# Patient Record
Sex: Male | Born: 1945 | Race: White | Hispanic: No | Smoking: Former smoker
Health system: Southern US, Community
[De-identification: ages and names within clinical notes are randomized; demographics above are authoritative.]

## PROBLEM LIST (undated history)

## (undated) DIAGNOSIS — K219 Gastro-esophageal reflux disease without esophagitis: Secondary | ICD-10-CM

## (undated) DIAGNOSIS — I739 Peripheral vascular disease, unspecified: Secondary | ICD-10-CM

## (undated) DIAGNOSIS — D649 Anemia, unspecified: Secondary | ICD-10-CM

## (undated) DIAGNOSIS — M79606 Pain in leg, unspecified: Secondary | ICD-10-CM

## (undated) DIAGNOSIS — R0602 Shortness of breath: Secondary | ICD-10-CM

## (undated) DIAGNOSIS — Z87891 Personal history of nicotine dependence: Secondary | ICD-10-CM

## (undated) DIAGNOSIS — IMO0001 Reserved for inherently not codable concepts without codable children: Secondary | ICD-10-CM

## (undated) DIAGNOSIS — J189 Pneumonia, unspecified organism: Secondary | ICD-10-CM

## (undated) DIAGNOSIS — M2550 Pain in unspecified joint: Secondary | ICD-10-CM

## (undated) DIAGNOSIS — I10 Essential (primary) hypertension: Secondary | ICD-10-CM

## (undated) DIAGNOSIS — M1990 Unspecified osteoarthritis, unspecified site: Secondary | ICD-10-CM

## (undated) HISTORY — DX: Anemia, unspecified: D64.9

## (undated) HISTORY — DX: Unspecified osteoarthritis, unspecified site: M19.90

## (undated) HISTORY — PX: JOINT REPLACEMENT: SHX530

## (undated) HISTORY — DX: Peripheral vascular disease, unspecified: I73.9

## (undated) HISTORY — DX: Pain in unspecified joint: M25.50

## (undated) HISTORY — DX: Gastro-esophageal reflux disease without esophagitis: K21.9

## (undated) HISTORY — DX: Personal history of nicotine dependence: Z87.891

## (undated) HISTORY — DX: Essential (primary) hypertension: I10

## (undated) HISTORY — DX: Reserved for inherently not codable concepts without codable children: IMO0001

## (undated) HISTORY — DX: Pain in leg, unspecified: M79.606

## (undated) HISTORY — PX: TOTAL HIP ARTHROPLASTY: SHX124

---
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
## 2009-12-13 ENCOUNTER — Encounter: Payer: Self-pay | Admitting: Cardiovascular Disease

## 2010-08-16 ENCOUNTER — Telehealth (INDEPENDENT_AMBULATORY_CARE_PROVIDER_SITE_OTHER): Payer: Self-pay | Admitting: *Deleted

## 2010-08-20 ENCOUNTER — Encounter: Payer: Self-pay | Admitting: Cardiovascular Disease

## 2010-08-20 ENCOUNTER — Encounter: Payer: Self-pay | Admitting: Cardiology

## 2010-08-20 ENCOUNTER — Ambulatory Visit: Payer: Self-pay | Admitting: Cardiovascular Disease

## 2010-08-20 ENCOUNTER — Encounter (HOSPITAL_COMMUNITY): Admission: RE | Admit: 2010-08-20 | Discharge: 2010-09-03 | Payer: Self-pay | Admitting: Family Medicine

## 2010-08-20 ENCOUNTER — Ambulatory Visit: Payer: Self-pay

## 2010-09-19 ENCOUNTER — Ambulatory Visit: Payer: Self-pay | Admitting: Vascular Surgery

## 2010-12-18 NOTE — Assessment & Plan Note (Signed)
Summary: Cardiology Nuclear Testing  Nuclear Med Background Indications for Stress Test: Evaluation for Ischemia   History: GXT  History Comments: 01/24/10 [id number]  Symptoms: Fatigue  Symptoms Comments: c/o (B) leg pain with walking, R>L   Nuclear Pre-Procedure Cardiac Risk Factors: History of Smoking, Hypertension, IDDM Type 2, Smoker Caffeine/Decaff Intake: none NPO After: 7:30 AM Lungs: Clear.  O2 Sat 98% on RA. IV 0.9% NS with Angio Cath: 22g     IV Site: R Hand IV Started by: Cathlyn Parsons, RN Chest Size (in) 42     Height (in): 71 Weight (lb): 163 BMI: 22.82 Tech Comments: Lopressor not held.  Nuclear Med Study 1 or 2 day study:  1 day     Stress Test Type:  Treadmill/Lexiscan Reading MD:  Kristeen Miss, MD     Referring MD:  [name], MD Resting Radionuclide:  Technetium 69m Tetrofosmin     Resting Radionuclide Dose:  11 mCi  Stress Radionuclide:  Technetium 56m Tetrofosmin     Stress Radionuclide Dose:  33 mCi   Stress Protocol Exercise Time (min):  2:00 min     Max HR:  93 bpm     Predicted Max HR:  155 bpm  Max Systolic BP: 168 mm Hg     Percent Max HR:  60 %Rate Pressure Product:  62130  Lexiscan: 0.4 mg   Stress Test Technologist:  [employer], CMA-N     Nuclear Technologist:  Doyne Keel, CNMT  Rest Procedure  Myocardial perfusion imaging was performed at rest 45 minutes following the intravenous administration of Technetium 9m Tetrofosmin.  Stress Procedure  The patient received IV Lexiscan 0.4 mg over 15-seconds with concurrent low level exercise and then Technetium 40m Tetrofosmin was injected at 30-seconds.  There were no significant changes with Lexiscan.  Quantitative spect images were obtained after a 45 minute delay.  QPS Raw Data Images:  Normal; no motion artifact; normal heart/lung ratio. Stress Images:  Normal homogeneous uptake in all areas of the myocardium. Rest Images:  Normal homogeneous uptake in all areas of the  myocardium. Subtraction (SDS):  No evidence of ischemia. Transient Ischemic Dilatation:  1.05  (Normal <1.22)  Lung/Heart Ratio:  .36  (Normal <0.45)  Quantitative Gated Spect Images QGS EDV:  98 ml QGS ESV:  40 ml QGS EF:  59 % QGS cine images:  Normal LV systolic function.  Findings Normal nuclear study      Overall Impression  Exercise Capacity: Lexiscan with low level  exercise. BP Response: Normal blood pressure response. Clinical Symptoms: No chest pain ECG Impression: No significant ST segment change suggestive of ischemia. Overall Impression: Normal stress nuclear study. Overall Impression Comments: Normal stress nulcear study  Appended Document: Cardiology Nuclear Testing copy sent to Dr. Christell Constant

## 2010-12-18 NOTE — Progress Notes (Signed)
Summary: Nuc Pre-Procedure  Phone Note Outgoing Call Call back at [facility] Phone [phone]   Call placed by: Antionette Char RN,  August 16, 2010 3:05 PM Call placed to: Patient Reason for Call: Confirm/change Appt Summary of Call: Reviewed information on [employer] Information Sheet (see scanned document for further details).  Spoke with patient.     Nuclear Med Background Indications for Stress Test: Evaluation for Ischemia   History: GXT  History Comments: 01/24/10 abn- Failed to achieve target HR  Symptoms: DOE    Nuclear Pre-Procedure Cardiac Risk Factors: Hypertension, Smoker

## 2011-02-12 ENCOUNTER — Encounter (INDEPENDENT_AMBULATORY_CARE_PROVIDER_SITE_OTHER): Payer: Medicare PPO | Admitting: Vascular Surgery

## 2011-02-12 DIAGNOSIS — I70219 Atherosclerosis of native arteries of extremities with intermittent claudication, unspecified extremity: Secondary | ICD-10-CM

## 2011-02-12 DIAGNOSIS — M2550 Pain in unspecified joint: Secondary | ICD-10-CM

## 2011-02-12 DIAGNOSIS — M79606 Pain in leg, unspecified: Secondary | ICD-10-CM

## 2011-02-12 HISTORY — DX: Pain in leg, unspecified: M79.606

## 2011-02-12 HISTORY — DX: Pain in unspecified joint: M25.50

## 2011-02-12 NOTE — Consult Note (Signed)
NEW PATIENT CONSULTATION  [name], [name] DOB:  1945/12/13                                       02/12/2011 CHART#:[id number]  The patient is a 65 year old gentleman with progressive worsening of claudication in both lower extremities, right equal to left.  This occurs in the calf after walking about one to two blocks and he is required to stop and rest for 1-5 minutes at which time he can proceed. He has no history of rest pain, nonhealing ulcers, infection or ulceration.  He has never had any treatment of this problem in the past.  CHRONIC MEDICAL PROBLEMS: 1. Type 1 diabetes mellitus. 2. Hypertension. 3. History of severe tobacco abuse.  The patient admits to smoking     three to five packs of cigarettes for most of his life but did stop     smoking 2 years ago.  Currently does not smoke. 4. Negative for hyperlipidemia, coronary artery disease, COPD or     stroke. 5. Has had a previous left hip replacement.  SOCIAL HISTORY:  He is widowed, has 2 children, is retired, currently does not use tobacco and drinks about 12 beers per week.  FAMILY HISTORY:  Positive for diabetes in a brother.  Negative for coronary artery disease and stroke.  REVIEW OF SYSTEMS:  Positive for arthritis and joint pain particularly in his right shoulder where he has a rotator cuff problem, reflux esophagitis, constipation.  All other systems were negative in complete review of systems.  PHYSICAL EXAM:  Vital signs:  Blood pressure 169/81, heart rate 62, respirations 14.  General:  He is a well-developed, well-nourished male who is no apparent distress, alert and oriented x3.  HEENT:  Normal for age.  EOMs intact.  Lungs:  Clear to auscultation.  No rhonchi or wheezing.  Cardiovascular:  Reveals a regular rhythm.  No murmurs. Carotid pulses are 3+.  No audible bruits.  Abdomen:  Soft, nontender with no masses.  Musculoskeletal:  Exam is free of major deformities. Neurological:   Normal.  Skin:  Free of rashes.  Lower extremity:  Exam reveals 2+ femoral, popliteal and posterior tibial pulse on the left. Right leg has a 2+ femoral, absent popliteal and distal pulses.  Both feet are adequately perfused with no infection.  I reviewed the lower extremity arterial Doppler studies which I interpreted in our vascular lab in November of 2011.  This reveals an ABI of 0.54 on the right and 0.85 on the left with bilateral superficial femoral occlusive disease, right being totally occluded, left being stenotic.  This patient does have femoral and popliteal occlusive disease causing his symptoms.  We will schedule him for an angiogram to be done by Dr. Imogene Burn on Thursday, April 5 to see if PTA and stenting is feasible treatment option versus bypass surgery.    Quita Skye Hart Rochester, M.D. Electronically Signed  JDL/MEDQ  D:  02/12/2011  T:  02/12/2011  Job:  [id number]  cc:   Belva Agee, NP

## 2011-02-21 ENCOUNTER — Ambulatory Visit (HOSPITAL_COMMUNITY)
Admission: RE | Admit: 2011-02-21 | Discharge: 2011-02-21 | Disposition: A | Discharge status: Home / Self Care | Payer: Medicare PPO | Source: Ambulatory Visit | Attending: Vascular Surgery | Admitting: Vascular Surgery

## 2011-02-21 DIAGNOSIS — I70219 Atherosclerosis of native arteries of extremities with intermittent claudication, unspecified extremity: Secondary | ICD-10-CM

## 2011-02-21 DIAGNOSIS — E109 Type 1 diabetes mellitus without complications: Secondary | ICD-10-CM | POA: Insufficient documentation

## 2011-02-21 DIAGNOSIS — I10 Essential (primary) hypertension: Secondary | ICD-10-CM | POA: Insufficient documentation

## 2011-02-21 DIAGNOSIS — Z87891 Personal history of nicotine dependence: Secondary | ICD-10-CM | POA: Insufficient documentation

## 2011-02-21 LAB — POCT I-STAT, CHEM 8
BUN: 14 mg/dL (ref 6–23)
Chloride: 104 mEq/L (ref 96–112)
HCT: 30 % — ABNORMAL LOW (ref 39.0–52.0)
Potassium: 5.2 mEq/L — ABNORMAL HIGH (ref 3.5–5.1)

## 2011-02-21 LAB — GLUCOSE, CAPILLARY: Glucose-Capillary: 184 mg/dL — ABNORMAL HIGH (ref 70–99)

## 2011-02-22 NOTE — Op Note (Signed)
NAMEWILBERN, PENNYPACKER NO.:  [id number]  MEDICAL RECORD NO.:  [id number]           PATIENT TYPE:  O  LOCATION:  [facility]                         FACILITY:  [facility]  PHYSICIAN:  Fransisco Hertz, MD       DATE OF BIRTH:  10/18/1946  DATE OF PROCEDURE:  02/21/2011 DATE OF DISCHARGE:  02/21/2011                              OPERATIVE REPORT   PROCEDURE:  Left common femoral artery cannulation under ultrasound guidance, aortogram bilateral leg runoff.  PREOPERATIVE DIAGNOSIS:  Bilateral leg claudication.  POSTOPERATIVE DIAGNOSIS:  Bilateral leg claudication.  SURGEON:  Fransisco Hertz, MD.  ANESTHESIA:  Conscious sedation.  ESTIMATED BLOOD LOSS:  Minimal.  CONTRAST:  137 mL.  SPECIMENS:  None.  FINDINGS:  Included: 1. Patent aorta. 2. Patent bilateral renal arteries.  The right has a stenosis about 2-     cm distal to the takeoff. 3. Patent superior mesenteric artery. 4. Patent bilateral common iliac arteries that are calcified and     patent bilateral external iliac artery. 5. Patent right internal iliac artery and there is a heavily diseased     left internal iliac artery that is nearly occluded. 6. Patent but atherosclerotic bilateral common femoral artery. 7. The right superficial femoral artery has a proximal knob that is     less than 2 mm in length, otherwise, the rest of the right     superficial femoral artery is occluded. 8. The left superficial femoral artery is patent, but had multiple     stenoses, one greater than 75% and two about 50%. 9. Bilateral profunda artery is patent. 10.The right above-the-knee popliteal artery reconstitutes. 11.The left above-the-knee popliteal artery is patent and in line with     SFA. 12.Bilateral popliteal arteries are patent at the level of the knee     and below-the-knee. 13.The bilateral trifurcations are patent. 14.Bilateral anterior tibial arteries are occluded. 15.Bilateral peroneal and posterior tibial arteries  continued down to     the level of the ankles. 16.The right runoff is a posterior tibial and peroneal, which are very     small only about 1-2 mm. 17.The left runoff is a posterior tibial.  INDICATIONS:  This is a 65 year old patient that presents with bilateral leg claudication by symptoms, left greater than right.  However, his ABIs demonstrate worse disease in the right leg.  Given his deterioration and symptomatology, Dr. Hart Rochester felt that diagnostic angiography was going to be necessary to determine whether there was anything that could be treated from an endovascular viewpoint or whether he would need surgical intervention.  The patient is aware of the risks of this procedure included bleeding, infection, possible access complications, possible embolization, possible rupture, possible need for emergent surgical intervention, and possible additional surgical operation needed.  The patient was aware of the risks and agreed to proceed forward.  DESCRIPTION OF OPERATION:  After full informed written consent was obtained from the patient, he was brought back to the angio suite and placed supine upon the angio table.  Prior to induction, he was connected to monitoring equipment, and then he was given conscious  sedation, amounts of which are documented in chart.  He was then prepped and draped in standard fashion for aortogram and bilateral leg runoff.  We turned our attention first to his left groin.  Under ultrasound, I identified the common femoral artery.  After injecting 1% lidocaine, I obtained some local anesthesia and then cannulated the left common femoral artery with an 18-gauge needle and then passed a Bentson wire up into the aorta.  The needle was exchanged for 5-French sheath over the wire, then the Omniflush catheter was loaded to the level of L1, this was connected to power injector circuit.  After performing a declotting and de- airing maneuver, I performed a power  injector aortogram.  The aortic findings are as listed above.  I then pulled the catheter down the level of the bifurcation and did oblique pelvic shot.  Findings of which are listed above.  I then set the patient up for bilateral leg automated runoff. The findings of which are listed above.  Based on these findings, I felt that there is inadequate residual superficial femoral artery to successfully complete a subintimal reconstitution of the SFA.  My concern is that stenting of the entry site might be necessary. Any stent placed in the SFA will be too close to the bifurcation and possibly could cover the profunda artery.  Subsequently, I do not feel it is in this patient's advantage to proceed forward with endovascular recanalization to right SFA.  In regards to the left leg, there is calcification in the SFA that I think it is better treated with orbital atherectomy.  Also with the presence of a left common femoral artery sheath, it is not possible geometry wise to proceed forward with this patient's intervention today.  I would addressed first the right leg as this is the more concerning leg in regards to his perfusion and then I would address the left leg at a different session with a right common femoral artery approach and an orbital atherectomy in SFA.  COMPLICATIONS:  None.  CONDITION:  Stable.     Fransisco Hertz, MD     BLC/MEDQ  D:  02/21/2011  T:  02/22/2011  Job:  [id number]  Electronically Signed by Leonides Sake MD on 02/22/2011 05:46:23 PM

## 2011-02-26 ENCOUNTER — Ambulatory Visit (INDEPENDENT_AMBULATORY_CARE_PROVIDER_SITE_OTHER): Payer: Medicare PPO | Admitting: Vascular Surgery

## 2011-02-26 DIAGNOSIS — I70219 Atherosclerosis of native arteries of extremities with intermittent claudication, unspecified extremity: Secondary | ICD-10-CM

## 2011-02-26 LAB — GLUCOSE, CAPILLARY

## 2011-02-27 ENCOUNTER — Encounter (HOSPITAL_COMMUNITY): Payer: Medicare PPO | Admitting: Radiology

## 2011-02-27 NOTE — Assessment & Plan Note (Signed)
OFFICE VISIT  TOU, HAYNER DOB:  [birth year]                                       02/26/2011 CHART#:[id number]  The patient returns today for further discussion regarding his severe claudication in the right leg.  He underwent angiography by Dr. Imogene Burn on April 5th.  He had a right superficial femoral occlusion noted with a very short stump of superficial femoral artery being patent proximally. He had reconstitution of his superficial femoral artery above the knee with some disease behind the knee joint, below knee patent popliteal artery with small tibial vessels and occlusion of the anterior tibial artery in the mid calf.  He is having severe claudication symptoms which are limiting him significantly and would like to proceed with revascularization.  He was not felt to be a candidate for PTA and stenting of his right superficial femoral artery.  He has some mild left superficial femoral disease which is a candidate for PTA.  CHRONIC MEDICAL PROBLEMS: 1. Type 1 diabetes mellitus. 2. Hypertension. 3. History of severe tobacco abuse. 4. Negative for the hyperlipidemia, coronary artery disease, COPD or     stroke - the patient had a negative stress Cardiolite performed     October 2011 with normal LV function, no evidence of ischemia.  SOCIAL HISTORY:  Widowed, has 2 children, is retired.  Does not use tobacco in a few years but does have a very strong history of tobacco abuse in the past.  REVIEW OF SYSTEMS:  Negative for chest pain, dyspnea on exertion, PND, orthopnea, chronic bronchitis.  All other systems in complete review of systems are negative.  PHYSICAL EXAMINATION:  Vital signs:  Blood pressure is 114/65, heart rate 83, respirations 16.  General:  He is a thin, well-developed male who is in no apparent stress.  Alert and oriented x3.  HEENT:  Normal for age.  Lungs:  Clear to auscultation.  No rhonchi or wheezing. Cardiovascular:  Regular  rhythm, no murmurs.  Carotid pulses 3+, no bruits.  Abdomen:  Soft, nontender with no masses.  Lower extremity exam reveals 3+ femoral, absent popliteal and distal pulse on the right, 3+ femoral and 1-2+ popliteal pulse on the left.  I visualized the great saphenous vein with the SonoSite today and it was marginal in size, possibly usable.  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  The patient would like proceed with femoral-popliteal bypass which we have scheduled for Wednesday, April 18.  I discussed this at length with him and his daughter and answered their questions. The risks and benefits are thoroughly discussed and he would like to proceed.    Quita Skye Hart Rochester, M.D. Electronically Signed  JDL/MEDQ  D:  02/26/2011  T:  02/27/2011  Job:  [id number]

## 2011-03-01 ENCOUNTER — Encounter (HOSPITAL_COMMUNITY)
Admission: RE | Admit: 2011-03-01 | Discharge: 2011-03-01 | Disposition: A | Discharge status: Home / Self Care | Payer: Medicare PPO | Source: Ambulatory Visit | Attending: Vascular Surgery | Admitting: Vascular Surgery

## 2011-03-01 ENCOUNTER — Other Ambulatory Visit: Payer: Self-pay | Admitting: Vascular Surgery

## 2011-03-01 ENCOUNTER — Ambulatory Visit (HOSPITAL_COMMUNITY)
Admission: RE | Admit: 2011-03-01 | Discharge: 2011-03-01 | Disposition: A | Discharge status: Home / Self Care | Payer: Medicare PPO | Source: Ambulatory Visit | Attending: Vascular Surgery | Admitting: Vascular Surgery

## 2011-03-01 DIAGNOSIS — I739 Peripheral vascular disease, unspecified: Secondary | ICD-10-CM

## 2011-03-01 DIAGNOSIS — Z01810 Encounter for preprocedural cardiovascular examination: Secondary | ICD-10-CM | POA: Insufficient documentation

## 2011-03-01 DIAGNOSIS — Z01812 Encounter for preprocedural laboratory examination: Secondary | ICD-10-CM | POA: Insufficient documentation

## 2011-03-01 DIAGNOSIS — Z01818 Encounter for other preprocedural examination: Secondary | ICD-10-CM | POA: Insufficient documentation

## 2011-03-01 LAB — COMPREHENSIVE METABOLIC PANEL
Albumin: 4 g/dL (ref 3.5–5.2)
BUN: 15 mg/dL (ref 6–23)
CO2: 25 mEq/L (ref 19–32)
Chloride: 104 mEq/L (ref 96–112)
Creatinine, Ser: 0.91 mg/dL (ref 0.4–1.5)
GFR calc non Af Amer: >60 mL/min (ref >60)
Glucose, Bld: 258 mg/dL — ABNORMAL HIGH (ref 70–99)
Total Bilirubin: 0.4 mg/dL (ref 0.3–1.2)

## 2011-03-01 LAB — URINE MICROSCOPIC-ADD ON

## 2011-03-01 LAB — URINALYSIS, ROUTINE W REFLEX MICROSCOPIC
Glucose, UA: >1000 mg/dL — AB
Leukocytes, UA: NEGATIVE
Protein, ur: NEGATIVE mg/dL

## 2011-03-01 LAB — PROTIME-INR
INR: 1.04 (ref 0.00–1.49)
Prothrombin Time: 13.8 seconds (ref 11.6–15.2)

## 2011-03-01 LAB — CBC
Hemoglobin: 9.3 g/dL — ABNORMAL LOW (ref 13.0–17.0)
MCHC: 30.3 g/dL (ref 30.0–36.0)
RBC: 4.04 MIL/uL — ABNORMAL LOW (ref 4.22–5.81)

## 2011-03-01 LAB — APTT: aPTT: 29 seconds (ref 24–37)

## 2011-03-01 LAB — ABO/RH: ABO/RH(D): O POS

## 2011-03-05 ENCOUNTER — Ambulatory Visit: Payer: Medicare PPO | Admitting: Vascular Surgery

## 2011-03-06 ENCOUNTER — Inpatient Hospital Stay (HOSPITAL_COMMUNITY): Payer: Medicare PPO

## 2011-03-06 ENCOUNTER — Inpatient Hospital Stay (HOSPITAL_COMMUNITY)
Admission: RE | Admit: 2011-03-06 | Discharge: 2011-03-09 | DRG: 253 | Disposition: A | Discharge status: Home / Self Care | Payer: Medicare PPO | Source: Ambulatory Visit | Attending: Vascular Surgery | Admitting: Vascular Surgery

## 2011-03-06 DIAGNOSIS — I10 Essential (primary) hypertension: Secondary | ICD-10-CM | POA: Diagnosis present

## 2011-03-06 DIAGNOSIS — E876 Hypokalemia: Secondary | ICD-10-CM | POA: Diagnosis present

## 2011-03-06 DIAGNOSIS — E109 Type 1 diabetes mellitus without complications: Secondary | ICD-10-CM | POA: Diagnosis present

## 2011-03-06 DIAGNOSIS — K219 Gastro-esophageal reflux disease without esophagitis: Secondary | ICD-10-CM | POA: Diagnosis present

## 2011-03-06 DIAGNOSIS — D62 Acute posthemorrhagic anemia: Secondary | ICD-10-CM | POA: Diagnosis not present

## 2011-03-06 DIAGNOSIS — M129 Arthropathy, unspecified: Secondary | ICD-10-CM | POA: Diagnosis present

## 2011-03-06 DIAGNOSIS — I70219 Atherosclerosis of native arteries of extremities with intermittent claudication, unspecified extremity: Principal | ICD-10-CM | POA: Diagnosis present

## 2011-03-06 HISTORY — PX: PR VEIN BYPASS GRAFT,AORTO-FEM-POP: 35551

## 2011-03-06 LAB — GLUCOSE, CAPILLARY
Glucose-Capillary: 75 mg/dL (ref 70–99)
Glucose-Capillary: 121 mg/dL — ABNORMAL HIGH (ref 70–99)
Glucose-Capillary: 142 mg/dL — ABNORMAL HIGH (ref 70–99)

## 2011-03-07 DIAGNOSIS — Z48812 Encounter for surgical aftercare following surgery on the circulatory system: Secondary | ICD-10-CM

## 2011-03-07 DIAGNOSIS — I739 Peripheral vascular disease, unspecified: Secondary | ICD-10-CM

## 2011-03-07 LAB — BASIC METABOLIC PANEL
CO2: 26 mEq/L (ref 19–32)
Calcium: 8 mg/dL — ABNORMAL LOW (ref 8.4–10.5)
Chloride: 109 mEq/L (ref 96–112)
Glucose, Bld: 38 mg/dL — CL (ref 70–99)
Sodium: 140 mEq/L (ref 135–145)

## 2011-03-07 LAB — HEMOGLOBIN A1C: Hgb A1c MFr Bld: 9 % — ABNORMAL HIGH (ref <5.7)

## 2011-03-07 LAB — GLUCOSE, CAPILLARY
Glucose-Capillary: 113 mg/dL — ABNORMAL HIGH (ref 70–99)
Glucose-Capillary: 92 mg/dL (ref 70–99)
Glucose-Capillary: 44 mg/dL — CL (ref 70–99)
Glucose-Capillary: 82 mg/dL (ref 70–99)

## 2011-03-07 LAB — CBC
HCT: 24.7 % — ABNORMAL LOW (ref 39.0–52.0)
Hemoglobin: 7.4 g/dL — ABNORMAL LOW (ref 13.0–17.0)
MCH: 22.6 pg — ABNORMAL LOW (ref 26.0–34.0)
MCHC: 30 g/dL (ref 30.0–36.0)
RBC: 3.27 MIL/uL — ABNORMAL LOW (ref 4.22–5.81)

## 2011-03-08 LAB — BASIC METABOLIC PANEL
BUN: 5 mg/dL — ABNORMAL LOW (ref 6–23)
CO2: 26 mEq/L (ref 19–32)
Calcium: 8 mg/dL — ABNORMAL LOW (ref 8.4–10.5)
GFR calc non Af Amer: >60 mL/min (ref >60)
Glucose, Bld: 285 mg/dL — ABNORMAL HIGH (ref 70–99)

## 2011-03-08 LAB — CROSSMATCH: Unit division: 0

## 2011-03-08 LAB — GLUCOSE, CAPILLARY
Glucose-Capillary: 178 mg/dL — ABNORMAL HIGH (ref 70–99)
Glucose-Capillary: 127 mg/dL — ABNORMAL HIGH (ref 70–99)
Glucose-Capillary: 206 mg/dL — ABNORMAL HIGH (ref 70–99)

## 2011-03-08 LAB — CBC
HCT: 29.3 % — ABNORMAL LOW (ref 39.0–52.0)
MCHC: 31.1 g/dL (ref 30.0–36.0)
MCV: 77.5 fL — ABNORMAL LOW (ref 78.0–100.0)
RDW: 15.5 % (ref 11.5–15.5)

## 2011-03-09 LAB — GLUCOSE, CAPILLARY: Glucose-Capillary: 227 mg/dL — ABNORMAL HIGH (ref 70–99)

## 2011-03-11 NOTE — Op Note (Signed)
[name], WESTERVELT NO.:  [id number]  MEDICAL RECORD NO.:  [id number]           PATIENT TYPE:  I  LOCATION:  3304                         FACILITY:  [facility]  PHYSICIAN:  Quita Skye. Hart Rochester, M.D.  DATE OF BIRTH:  1946-10-26  DATE OF PROCEDURE:  03/06/2011 DATE OF DISCHARGE:                              OPERATIVE REPORT   PREOPERATIVE DIAGNOSIS:  Severe claudication, right leg secondary to femoral-popliteal occlusive disease.  POSTOPERATIVE DIAGNOSIS:  Severe claudication, right leg secondary to femoral-popliteal occlusive disease.  OPERATION:  Right common femoral to popliteal (below knee) bypass using a composite non-reverse saphenous vein graft in right leg with intraoperative arteriogram.  SURGEON:  Quita Skye. Hart Rochester, MD  FIRST ASSISTANT:  Della Goo, PA-C  ANESTHESIA:  General endotracheal.  PROCEDURE:  The patient was taken to the operating room, placed in the supine position, at which time the right upper extremity was prepped with Betadine scrub and solution, draped in routine sterile manner after induction of satisfactory general endotracheal anesthesia.  Saphenous vein was exposed from the saphenofemoral junction to the midcalf through multiple incisions along the medial aspect of the right leg.  Its branches were ligated with 3-0 and 4-0 silk ties, divided, was removed, gently dilated with heparinized saline, marked for orientation purposes. It was a satisfactory vein except for a 4- to 5-cm segment in the midportion which was not of adequate size.  It was therefore resected, and after beveling both ends an end-to-end anastomosis was done with two continuous 6-0 Prolene sutures from the heel and the toe.  Following this, superficial and profunda femoris arteries were dissected free in the inguinal incision.  There was some plaque formation in the common femoral artery, but it was soft anteriorly and had an excellent pulse. Superficial femoral  artery was chronically occluded.  Popliteal artery was then exposed below the knee where it was a widely patent vessel, but there was some disease proximally palpable below the knee.  Subfascial anatomic tunnel was created.  The patient was heparinized.  Femoral vessels were occluded with vascular clamps.  Longitudinal opening made in the very distal end of the common femoral artery, extended with Potts scissors.  Proximal end of the vein was spatulated, anastomosed end-to- side with 6-0 Prolene.  Vessel loops were released.  There was an excellent pulse down to the first set of competent valves.  Using a retrograde valvulotome, the valves were rendered incompetent which resulted in excellent flow out the distal end of the vein graft.  The vein was carefully delivered through the tunnel.  Popliteal artery occluded proximally and distally below the knee, opened with 15 blade, extended with Potts scissors.  It was an excellent vessel.  Vein was carefully measured, anastomosed end-to-side with 6-0 Prolene. Vesseloops were released.  There was an excellent pulse and Doppler flow in the popliteal artery with good Doppler flow in the posterior tibial and anterior tibial arteries and the ankle.  Intraoperative arteriogram was performed which revealed widely patent anastomosis with the best runoff vessel being the posterior tibial and peroneals.  No protamine was given.  The wound was irrigated with  saline, closed in layers with Vicryl in subcuticular fashion.  Sterile dressing applied.  The patient taken to recovery room in satisfactory condition.     Quita Skye Hart Rochester, M.D.     JDL/MEDQ  D:  03/06/2011  T:  03/06/2011  Job:  [id number]  Electronically Signed by Josephina Gip M.D. on 03/11/2011 10:28:38 AM

## 2011-04-01 NOTE — Discharge Summary (Signed)
[name], [name] NO.:  [id number]  MEDICAL RECORD NO.:  [id number]           PATIENT TYPE:  LOCATION:                                 FACILITY:  PHYSICIAN:  Quita Skye. Hart Rochester, M.D.  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  PAST MEDICAL HISTORY AND DISCHARGE DIAGNOSES: 1. 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 2. Postoperative anemia, status post transfusion. 3. Type 1 diabetes mellitus. 4. Hypertension. 5. History of severe tobacco abuse.  BRIEF HISTORY:  The patient was followed by Dr. Hart Rochester for severe claudication in the right lower extremity.  He had undergone angiography by Dr. Deborah Chalk on April 5, which revealed a right SFA occlusion and reconstitution of his SFA below the knee with some disease behind the knee joint.  There is a patent right below-the-knee popliteal artery with small tibial vessels and occlusion of the anterior tibial artery in the mid calf.  Secondary to these findings and the patient's severe claudication, it was felt that he should undergo surgical revascularization.  HOSPITAL COURSE:  The patient was admitted and taken to the OR on March 06, 2011 for a right common femoral to below-the-knee popliteal artery bypass using a composite nonreversed saphenous vein graft.  The patient tolerated the procedure well, was hemodynamically stable immediately postoperatively.  The patient was transferred from the OR to postanesthesia care unit in stable addition.  The patient was extubated without complication and woke up from anesthesia neurologically intact.  On postop day #1, the patient was without complaint, except for some nausea and vomiting, which  is a chronic issue due to his reflux disease.  He felt better with the administration of IV Protonix.  The patient was started on p.o. Protonix.  The patient was noted to have an acute blood loss anemia on postop day #1 with a hemoglobin of 7.4.  He was transfused with 2 units of packed RBCs and his hemoglobin and hematocrit responded appropriately.  His CBGs were also very low in the early postoperative course secondary to his continuation of his normal Lantus medication with a decrease in his diet.  As his diet increased, his Lantus was decreased and his CBGs were better maintained.  The patient was able to increase his ambulation without difficulty.  His Foley was discontinued and he was able to void without difficulty.  On postop day #3, March 09, 2011, the patient was comfortable.  He was afebrile with stable vital signs.  PHYSICAL EXAM:  The incision was clean, dry, and intact.  There is a palpable popliteal pulse.  Bilateral lower extremities were warm.  The patient was felt to be in stable condition for discharge home on p.o. Protonix and a decreased dosage of his daily insulin.  LABORATORY DATA:  CBC on March 08, 2011, white count 7.1, hemoglobin 9.1, hematocrit 29.3, platelets 316.  BMP  on March 08, 2011, sodium 135, potassium 4.8, BUN 5, creatinine 0.8.  DISCHARGE INSTRUCTIONS:  The patient received specific written discharge instructions regarding diet, activity, and wound care.  He was to follow up with Dr. Hart Rochester in 2 weeks after discharge.  The patient was going to be contacted with the date and time of that appointment by the office.  DISCHARGING MEDICATIONS: 1. Flonase 2 sprays nasally daily p.r.n. 2. NovoLog 8-12 units subcu sliding scale. 3. Lantus insulin 35 units subcu q.a.m.  The patient monitors and     adjust this according to his CBGs. 4. Hydrocodone/acetaminophen 10/325 mg one p.o. t.i.d. p.r.n. 5. Ibuprofen 200 mg 1-2 q.8 h. p.r.n. 6. Aspirin 81 mg  daily. 7. Metoprolol 50 mg b.i.d. 8. Lisinopril 20 mg daily. 9. Protonix 40 mg p.o. b.i.d.     Pecola Leisure, PA   ______________________________ Quita Skye Hart Rochester, M.D.    AY/MEDQ  D:  03/14/2011  T:  03/15/2011  Job:  [id number]  Electronically Signed by Pecola Leisure PA on 03/18/2011 04:15:35 PM Electronically Signed by Josephina Gip M.D. on 04/01/2011 03:53:59 PM

## 2011-04-02 ENCOUNTER — Ambulatory Visit (INDEPENDENT_AMBULATORY_CARE_PROVIDER_SITE_OTHER): Payer: Medicare PPO | Admitting: Vascular Surgery

## 2011-04-02 ENCOUNTER — Encounter (INDEPENDENT_AMBULATORY_CARE_PROVIDER_SITE_OTHER): Payer: Medicare PPO

## 2011-04-02 DIAGNOSIS — Z48812 Encounter for surgical aftercare following surgery on the circulatory system: Secondary | ICD-10-CM

## 2011-04-02 DIAGNOSIS — I739 Peripheral vascular disease, unspecified: Secondary | ICD-10-CM

## 2011-04-02 DIAGNOSIS — I70219 Atherosclerosis of native arteries of extremities with intermittent claudication, unspecified extremity: Secondary | ICD-10-CM

## 2011-04-02 NOTE — Procedures (Signed)
LOWER EXTREMITY ARTERIAL DUPLEX   INDICATION:  Claudication.   HISTORY:  Diabetes:  Yes.  Cardiac:  No.  Hypertension:  Yes.  Smoking:  Previous.  Previous Surgery:  No.   SINGLE LEVEL ARTERIAL EXAM                          RIGHT                LEFT  Brachial:               153                  158  Anterior tibial:        86                   Inaudible  Posterior tibial:       86                   134  Peroneal:  Ankle/Brachial Index:   0.54                 0.85   LOWER EXTREMITY ARTERIAL DUPLEX EXAM   DUPLEX:  1. Right superficial femoral artery appears occluded through the mid      thigh level, where it is then seen with minimal flow and monophasic      waveforms.  2. Left SFA is patent with biphasic waveforms.  There is an area of      stenosis in the proximal thigh with a velocity of 203 cm/s.   IMPRESSION:  1. Right proximal occlusion of the superficial femoral artery.  2. Patent left superficial femoral artery with velocity as described      above.    ___________________________________________  Quita Skye. Hart Rochester, M.D.   EM/MEDQ  D:  09/19/2010  T:  09/19/2010  Job:  [id number]

## 2011-04-03 NOTE — Assessment & Plan Note (Signed)
OFFICE VISIT  MCKYLE, SOLANKI DOB:  12/15/1945                                       04/02/2011 CHART#:[id number]  Patient returns for initial follow-up regarding his right femoral- popliteal bypass graft which I performed April 18 for severe claudication of the right leg secondary to severe superficial femoral occlusive disease.  He required a composite non-reverse saphenous vein graft.  He has had some moderate edema postoperatively but has had complete resolution of his claudication symptoms.  He injured his right foot out in the yard postoperatively, he states, and had a few blistered area which are slowly healing.  He is not elevating his right leg adequately at the present time.  He was studied by Dr. Imogene Burn preoperatively, who also noted multiple lesions in his left superficial femoral artery that were felt to be candidates for atherectomy at a later date.  Today I ordered lower extremity arterial Dopplers which revealed an ABI of 1.0 on the right with triphasic flow and 0.94 on the left with triphasic flow.  Waveforms are better in the right leg than the left.  PHYSICAL EXAMINATION:  Blood pressure is 124/59, heart rate 75, respirations 18.  Incisions in the right leg have healed nicely.  He has a 3+ femoral and popliteal pulse with 1+ to 2+ edema below the knee. The left leg has a 3+ femoral pulse and a 2+ popliteal pulse.  I stressed the importance of elevating the right leg at night and during the day to decrease the edema.  I will have the patient return in 3 months to see Dr. Imogene Burn, who can review the angio's at that time to see if he feels atherectomy treatment of the left SFA is indicated at this time and let him make that decision.  By then, it will be clear whether or not patient is having significant symptoms in the left leg from his occlusive disease in the left superficial femoral artery.    Quita Skye Hart Rochester, M.D. Electronically  Signed  JDL/MEDQ  D:  04/02/2011  T:  04/03/2011  Job:  [id number]

## 2011-07-05 ENCOUNTER — Ambulatory Visit: Payer: Medicare PPO | Admitting: Vascular Surgery

## 2011-07-10 ENCOUNTER — Encounter: Payer: Self-pay | Admitting: Vascular Surgery

## 2011-07-10 NOTE — Progress Notes (Signed)
[facility] [place]  Established Intermittent Claudication  History of Present Illness  [name] is a 65 y.o. male who presents with chief complaint: L leg claudication.  The patient is s/p R CFA to BK pop bypass with composite non-reversed GSV.  On his previous angiogram, his L SFA demonstrated atherosclerotic disease that may be amendable to orbital atherectomy.  The patient's symptoms have not progressed.  The patient's symptoms are: none.  The patient's treatment regimen currently included: maximal medical management and R fem-pop.  Past Medical History, Past Surgical History, Social History, Family History, Medications, Allergies, and Review of Systems are unchanged from previous evaluation on 04/02/11.  Physical Examination  Filed Vitals:   07/12/11 1005  BP: 155/72  Pulse: 101  Resp: 20    General: A&O x 3, WDWN, slender  Pulmonary: Sym exp, good air movt, CTAB, no rales, rhonchi, & wheezing  Cardiac: RRR, Nl S1, S2, no Murmurs, rubs or gallops  Vascular: Vessel Right Left  Radial Palpable Palpable  Ulnary Palpable Palpable  Brachial Palpable Palpable  Carotid Palpable, without bruit Palpable, without bruit  Aorta Non-palpable N/A  Femoral Palpable Palpable  Popliteal Non-palpable Non-palpable  PT Palpable Palpable  DP Palpable Palpable   Musculoskeletal: M/S 5/5 throughout , Extremities without ischemic changes   Neurologic: Pain and light touch intact in extremities , Motor exam as listed above  Non-Invasive Vascular Imaging ABI (Date: 07/12/11)  RLE: 0.97, PT and DP: biphasic  LLE: 1.19, PT: triphasic, DP: biphasic  BLE Arterial Duplex (Date: 07/12/11)  Patent L femoro-popliteal system with velocities 49-172 c/s  Patent R fem-BK pop BPG w/ mid-segment stenosis at 476 c/s  Medical Decision Making  [name] is a 65 y.o. male who presents with: resolution of B leg intermittent claudication without evidence of critical  limb ischemia.  Based on the patient's vascular studies and examination, I discussed with the patient possible diagnostic angiogram and intervention on area of possible stenosis in the R fem-pop bypass conduit.  The patient was reluctant to proceed at this time given his asx status.  I recommended we repeat the studies in 3 months, and if his degree of stenosis increases, proceeding with possible intervention at that time.  In regards to the left leg, he is asx with multiphasic flow in the left leg so I don't think immediate intervention is necessary.  I discussed in depth with the patient the nature of atherosclerosis, and emphasized the importance of maximal medical management including strict control of blood pressure, blood glucose, and lipid levels, obtaining regular exercise, and cessation of smoking.  The patient is aware that without maximal medical management the underlying atherosclerotic disease process will progress, limiting the benefit of any interventions.  The patient will follow up with Dr. Hart Rochester in 3 months.  Thank you for allowing [place] to participate in this patient's care.  Leonides Sake, MD [facility] [place] Office: [phone] Pager: [phone]

## 2011-07-11 ENCOUNTER — Encounter: Payer: Self-pay | Admitting: Vascular Surgery

## 2011-07-12 ENCOUNTER — Encounter (INDEPENDENT_AMBULATORY_CARE_PROVIDER_SITE_OTHER): Payer: Medicare PPO

## 2011-07-12 ENCOUNTER — Ambulatory Visit (INDEPENDENT_AMBULATORY_CARE_PROVIDER_SITE_OTHER): Payer: Medicare PPO | Admitting: Vascular Surgery

## 2011-07-12 ENCOUNTER — Encounter: Payer: Self-pay | Admitting: Vascular Surgery

## 2011-07-12 VITALS — BP 155/72 | HR 101 | Resp 20 | Ht 71.0 in | Wt 165.0 lb

## 2011-07-12 DIAGNOSIS — I739 Peripheral vascular disease, unspecified: Secondary | ICD-10-CM

## 2011-07-12 DIAGNOSIS — Z48812 Encounter for surgical aftercare following surgery on the circulatory system: Secondary | ICD-10-CM

## 2011-07-12 DIAGNOSIS — I70219 Atherosclerosis of native arteries of extremities with intermittent claudication, unspecified extremity: Secondary | ICD-10-CM

## 2011-07-26 NOTE — Procedures (Unsigned)
BYPASS GRAFT EVALUATION  INDICATION:  Followup right bypass graft.  HISTORY: Diabetes:  Yes. Cardiac:  Unknown. Hypertension:  Yes. Smoking:  Previous. Previous Surgery:  Right common femoral artery to below knee popliteal bypass graft using nonreversed saphenous vein performed 03/06/2011.  SINGLE LEVEL ARTERIAL EXAM                              RIGHT              LEFT Brachial:                    155                152 Anterior tibial:             151                153 Posterior tibial:            148                184 Peroneal: Ankle/brachial index:        0.97               1.19  PREVIOUS ABI:  Date:  04/02/2011  RIGHT:  1.00  LEFT:  0.94  LOWER EXTREMITY BYPASS GRAFT DUPLEX EXAM:  DUPLEX:  Patent right femoral to popliteal bypass graft with an area of stenosis and elevated velocity of 476 cm/s noted in the mid thigh region. Patent left lower extremity arterial system with scattered plaque noted throughout.  IMPRESSION: 1. Bilateral ankle brachial indices are within normal limits. 2. Patent right femoral to below knee popliteal bypass graft with     elevated velocities in the mid thigh region as mentioned above. 3. Patent left lower extremity arterial system with velocities shown     on the following worksheet.       ___________________________________________ Fransisco Hertz, MD  EM/MEDQ  D:  07/12/2011  T:  07/12/2011  Job:  [id number]

## 2011-08-15 ENCOUNTER — Inpatient Hospital Stay (HOSPITAL_COMMUNITY)
Admission: AD | Admit: 2011-08-15 | Discharge: 2011-08-21 | DRG: 378 | Disposition: A | Discharge status: Home / Self Care | Payer: Medicare PPO | Source: Ambulatory Visit | Attending: Internal Medicine | Admitting: Internal Medicine

## 2011-08-15 DIAGNOSIS — K219 Gastro-esophageal reflux disease without esophagitis: Secondary | ICD-10-CM | POA: Diagnosis present

## 2011-08-15 DIAGNOSIS — Z794 Long term (current) use of insulin: Secondary | ICD-10-CM

## 2011-08-15 DIAGNOSIS — K449 Diaphragmatic hernia without obstruction or gangrene: Secondary | ICD-10-CM | POA: Diagnosis present

## 2011-08-15 DIAGNOSIS — I5030 Unspecified diastolic (congestive) heart failure: Secondary | ICD-10-CM | POA: Diagnosis present

## 2011-08-15 DIAGNOSIS — K922 Gastrointestinal hemorrhage, unspecified: Principal | ICD-10-CM | POA: Diagnosis present

## 2011-08-15 DIAGNOSIS — D62 Acute posthemorrhagic anemia: Secondary | ICD-10-CM | POA: Diagnosis present

## 2011-08-15 DIAGNOSIS — I509 Heart failure, unspecified: Secondary | ICD-10-CM | POA: Diagnosis present

## 2011-08-15 DIAGNOSIS — I10 Essential (primary) hypertension: Secondary | ICD-10-CM | POA: Diagnosis present

## 2011-08-15 DIAGNOSIS — E1069 Type 1 diabetes mellitus with other specified complication: Secondary | ICD-10-CM | POA: Diagnosis present

## 2011-08-15 LAB — GLUCOSE, CAPILLARY: Glucose-Capillary: 97 mg/dL (ref 70–99)

## 2011-08-16 LAB — CBC
HCT: 26.2 % — ABNORMAL LOW (ref 39.0–52.0)
Hemoglobin: 6.5 g/dL — CL (ref 13.0–17.0)
MCH: 20.9 pg — ABNORMAL LOW (ref 26.0–34.0)
MCHC: 30.5 g/dL (ref 30.0–36.0)
MCV: 69.5 fL — ABNORMAL LOW (ref 78.0–100.0)
Platelets: 334 10*3/uL (ref 150–400)
Platelets: 350 10*3/uL (ref 150–400)
RBC: 3.11 MIL/uL — ABNORMAL LOW (ref 4.22–5.81)
RDW: 19.1 % — ABNORMAL HIGH (ref 11.5–15.5)
WBC: 5.5 10*3/uL (ref 4.0–10.5)

## 2011-08-16 LAB — BASIC METABOLIC PANEL
CO2: 25 mEq/L (ref 19–32)
Chloride: 111 mEq/L (ref 96–112)
Creatinine, Ser: 0.72 mg/dL (ref 0.50–1.35)
Glucose, Bld: 67 mg/dL — ABNORMAL LOW (ref 70–99)
Sodium: 141 mEq/L (ref 135–145)

## 2011-08-16 LAB — GLUCOSE, CAPILLARY
Glucose-Capillary: 36 mg/dL — CL (ref 70–99)
Glucose-Capillary: 58 mg/dL — ABNORMAL LOW (ref 70–99)
Glucose-Capillary: 63 mg/dL — ABNORMAL LOW (ref 70–99)
Glucose-Capillary: 197 mg/dL — ABNORMAL HIGH (ref 70–99)

## 2011-08-16 LAB — DIFFERENTIAL
Basophils Relative: 1 % (ref 0–1)
Eosinophils Absolute: 0.1 10*3/uL (ref 0.0–0.7)
Lymphocytes Relative: 24 % (ref 12–46)
Monocytes Relative: 5 % (ref 3–12)
Neutro Abs: 3.7 10*3/uL (ref 1.7–7.7)
Neutrophils Relative %: 69 % (ref 43–77)

## 2011-08-16 LAB — HEMOGLOBIN A1C
Hgb A1c MFr Bld: 8.5 % — ABNORMAL HIGH (ref <5.7)
Mean Plasma Glucose: 197 mg/dL — ABNORMAL HIGH (ref <117)

## 2011-08-16 LAB — PREPARE RBC (CROSSMATCH)

## 2011-08-17 ENCOUNTER — Other Ambulatory Visit: Payer: Self-pay | Admitting: Gastroenterology

## 2011-08-17 DIAGNOSIS — D649 Anemia, unspecified: Secondary | ICD-10-CM

## 2011-08-17 DIAGNOSIS — D126 Benign neoplasm of colon, unspecified: Secondary | ICD-10-CM

## 2011-08-17 DIAGNOSIS — R195 Other fecal abnormalities: Secondary | ICD-10-CM

## 2011-08-17 LAB — GLUCOSE, CAPILLARY
Glucose-Capillary: 72 mg/dL (ref 70–99)
Glucose-Capillary: 96 mg/dL (ref 70–99)

## 2011-08-17 LAB — CBC
HCT: 27.7 % — ABNORMAL LOW (ref 39.0–52.0)
HCT: 28.5 % — ABNORMAL LOW (ref 39.0–52.0)
Hemoglobin: 8.8 g/dL — ABNORMAL LOW (ref 13.0–17.0)
MCH: 22.1 pg — ABNORMAL LOW (ref 26.0–34.0)
MCV: 71.8 fL — ABNORMAL LOW (ref 78.0–100.0)
Platelets: 303 10*3/uL (ref 150–400)
RBC: 3.76 MIL/uL — ABNORMAL LOW (ref 4.22–5.81)
RBC: 3.86 MIL/uL — ABNORMAL LOW (ref 4.22–5.81)
RDW: 19.3 % — ABNORMAL HIGH (ref 11.5–15.5)
WBC: 5.5 10*3/uL (ref 4.0–10.5)
WBC: 6.1 10*3/uL (ref 4.0–10.5)

## 2011-08-18 ENCOUNTER — Inpatient Hospital Stay (HOSPITAL_COMMUNITY): Payer: Medicare PPO

## 2011-08-18 DIAGNOSIS — D649 Anemia, unspecified: Secondary | ICD-10-CM

## 2011-08-18 DIAGNOSIS — D126 Benign neoplasm of colon, unspecified: Secondary | ICD-10-CM

## 2011-08-18 DIAGNOSIS — R195 Other fecal abnormalities: Secondary | ICD-10-CM

## 2011-08-18 LAB — GLUCOSE, CAPILLARY
Glucose-Capillary: 60 mg/dL — ABNORMAL LOW (ref 70–99)
Glucose-Capillary: 100 mg/dL — ABNORMAL HIGH (ref 70–99)
Glucose-Capillary: 384 mg/dL — ABNORMAL HIGH (ref 70–99)

## 2011-08-18 LAB — BASIC METABOLIC PANEL
BUN: 6 mg/dL (ref 6–23)
CO2: 21 mEq/L (ref 19–32)
Calcium: 8.5 mg/dL (ref 8.4–10.5)
GFR calc non Af Amer: >60 mL/min (ref >60)
Glucose, Bld: 60 mg/dL — ABNORMAL LOW (ref 70–99)

## 2011-08-18 LAB — CBC
Platelets: 335 10*3/uL (ref 150–400)
RDW: 19.9 % — ABNORMAL HIGH (ref 11.5–15.5)
WBC: 5 10*3/uL (ref 4.0–10.5)

## 2011-08-19 LAB — GLUCOSE, CAPILLARY
Glucose-Capillary: 353 mg/dL — ABNORMAL HIGH (ref 70–99)
Glucose-Capillary: 207 mg/dL — ABNORMAL HIGH (ref 70–99)

## 2011-08-19 LAB — CROSSMATCH
ABO/RH(D): O POS
Antibody Screen: NEGATIVE
Unit division: 0
Unit division: 0
Unit division: 0

## 2011-08-19 LAB — BASIC METABOLIC PANEL
CO2: 23 mEq/L (ref 19–32)
Calcium: 8.9 mg/dL (ref 8.4–10.5)
Chloride: 102 mEq/L (ref 96–112)
Glucose, Bld: 377 mg/dL — ABNORMAL HIGH (ref 70–99)
Sodium: 138 mEq/L (ref 135–145)

## 2011-08-19 LAB — CBC
Hemoglobin: 10 g/dL — ABNORMAL LOW (ref 13.0–17.0)
MCH: 21.7 pg — ABNORMAL LOW (ref 26.0–34.0)
MCV: 72.7 fL — ABNORMAL LOW (ref 78.0–100.0)
Platelets: 354 10*3/uL (ref 150–400)
RBC: 4.61 MIL/uL (ref 4.22–5.81)
WBC: 5.6 10*3/uL (ref 4.0–10.5)

## 2011-08-19 LAB — H. PYLORI ANTIBODY, IGG: H Pylori IgG: 1.96 {ISR} — ABNORMAL HIGH

## 2011-08-20 LAB — CBC
HCT: 32.4 % — ABNORMAL LOW (ref 39.0–52.0)
Hemoglobin: 9.7 g/dL — ABNORMAL LOW (ref 13.0–17.0)
MCH: 21.9 pg — ABNORMAL LOW (ref 26.0–34.0)
MCHC: 29.9 g/dL — ABNORMAL LOW (ref 30.0–36.0)
MCV: 73.1 fL — ABNORMAL LOW (ref 78.0–100.0)
RDW: 21.3 % — ABNORMAL HIGH (ref 11.5–15.5)

## 2011-08-20 LAB — GLUCOSE, CAPILLARY
Glucose-Capillary: 470 mg/dL — ABNORMAL HIGH (ref 70–99)
Glucose-Capillary: 455 mg/dL — ABNORMAL HIGH (ref 70–99)
Glucose-Capillary: 418 mg/dL — ABNORMAL HIGH (ref 70–99)

## 2011-08-21 LAB — BASIC METABOLIC PANEL
CO2: 26 mEq/L (ref 19–32)
Calcium: 9 mg/dL (ref 8.4–10.5)
Creatinine, Ser: 0.83 mg/dL (ref 0.50–1.35)
GFR calc non Af Amer: 90 mL/min — ABNORMAL LOW (ref >90)
Sodium: 138 mEq/L (ref 135–145)

## 2011-08-21 LAB — CBC
MCH: 21.9 pg — ABNORMAL LOW (ref 26.0–34.0)
MCHC: 30.5 g/dL (ref 30.0–36.0)
MCV: 71.7 fL — ABNORMAL LOW (ref 78.0–100.0)
Platelets: 321 10*3/uL (ref 150–400)
RBC: 4.48 MIL/uL (ref 4.22–5.81)
RDW: 21.4 % — ABNORMAL HIGH (ref 11.5–15.5)

## 2011-08-21 LAB — GLUCOSE, CAPILLARY
Glucose-Capillary: 319 mg/dL — ABNORMAL HIGH (ref 70–99)
Glucose-Capillary: 287 mg/dL — ABNORMAL HIGH (ref 70–99)

## 2011-08-23 NOTE — Consult Note (Signed)
[name], [name] NO.:  [id number]  MEDICAL RECORD NO.:  [id number]  LOCATION:  4710                         FACILITY:  [facility]  PHYSICIAN:  Jordan Hawks. Elnoria Howard, MD    DATE OF BIRTH:  07/22/1946  DATE OF CONSULTATION:  08/15/2011 DATE OF DISCHARGE:                                CONSULTATION   REASON FOR CONSULTATION:  Anemia.  This is an unassigned [facility] patient.  PRIMARY CARE PROVIDER:  Dr. Belva Agee at [facility].  HISTORY OF PRESENT ILLNESS:  This is a 65 year old gentleman with a past medical history of diabetes, hypertension, status post femoral-popliteal bypass in 2012, and prior tobacco use who was admitted to the hospital with a hemoglobin of 5.9.  Apparently, the patient reports that his daughter was concerned about his situation and she had requested a hemoglobin to be drawn at the primary care physician's office and at that time he was noted to have a hemoglobin of 5.9.  The patient reports having a bowel movement approximately 1 week ago and it was black at that time and it was in the consistency of diarrhea but subsequently has not had any further bowel movements.  He does have some mid abdominal type of pain.  No nausea, vomiting.  There is a history of using NSAIDs where he takes approximately 400 mg of ibuprofen as well as a [employer] on a daily basis but this has been his routine for a number of years.  He denies having a prior colonoscopy in the past and there is no known family history of colon cancer.  As a result of the current findings, a GI consultation was requested for further evaluation and treatment.  PAST MEDICAL AND SURGICAL HISTORY:  As stated above.  FAMILY HISTORY:  Noncontributory.  SOCIAL HISTORY:  Negative for alcohol, tobacco, illicit drug use although he did quit smoking approximately 5 months ago and he also stopped drinking 1-2 months ago because of the burning sensation in  his abdomen.  REVIEW OF SYSTEMS:  Is as stated above in history of present illness, otherwise negative.  MEDICATIONS: 1. Sliding-scale insulin. 2. Lisinopril. 3. Metoprolol. 4. Protonix. 5. Vicodin. 6. Morphine. 7. Zofran.  ALLERGIES:  No known drug allergies.  PHYSICAL EXAMINATION:  VITAL SIGNS:  Stable. GENERAL:  The patient is in no acute distress, alert and oriented. HEENT:  Normocephalic, atraumatic.  Extraocular muscles intact. NECK:  Supple.  No lymphadenopathy. LUNGS:  Clear to auscultation bilaterally. CARDIOVASCULAR:  Regular rate and rhythm. ABDOMEN:  Flat, soft, nontender, nondistended. EXTREMITIES:  No clubbing, cyanosis or edema. RECTAL:  Heme positive with brown hard stool.  LABORATORY VALUES:  White blood cell count 16.4, hemoglobin 5.9, platelets at 431.  IMPRESSION:  Gastrointestinal bleed.  I am uncertain about the source. Certainly, he does have risk factors for nonsteroidal anti-inflammatory drug-induced ulcer disease, but he may also have a proximal lower gastrointestinal source with his current findings.  PLAN:  For an EGD and colonoscopy and further recommendations pending findings on exam.     Jordan Hawks. Elnoria Howard, MD     PDH/MEDQ  D:  08/15/2011  T:  08/16/2011  Job:  [id number]  Electronically Signed by Jeani Hawking MD on 08/23/2011 08:15:41 AM

## 2011-08-25 NOTE — H&P (Signed)
NAMEEURA, RADABAUGH NO.:  [id number]  MEDICAL RECORD NO.:  [id number]  LOCATION:  3008                         FACILITY:  [facility]  PHYSICIAN:  Peggye Pitt, M.D. DATE OF BIRTH:  1946/06/23  DATE OF ADMISSION:  08/15/2011 DATE OF DISCHARGE:                             HISTORY & PHYSICAL   PCP:  Georga Hacking. Weeks, MD, with [facility].  CHIEF COMPLAINT:  Pale and weak.  HISTORY OF PRESENT ILLNESS:  [name] is a pleasant 65 year old Caucasian gentleman who has a history of insulin-dependent diabetes mellitus, hypertension, he is status post a right fem-pop bypass in April 2012.  He went to his PCP's office today for diabetic management and there he was noted to be pale and weak, and complaining of burning in his stomach.  They did a hemoglobin and his hemoglobin was found to be 5.9.  They subsequently did a rectal exam and he was found to have dark colored stools that were positive for occult blood.  They subsequently called [place] for a direct admission to the hospital.  The patient does admit to taking 400 mg of ibuprofen on a daily basis as well as 2-3 Goody's Powders a day.  He states he "takes this out of habit" there really is no true pain source that he can identify.  He has been prescribed 40 mg of omeprazole a day; however, he in addition to this takes two 20-mg tablets of omeprazole over-the-counter to total 80 mg a day.  ALLERGIES:  HE HAS NO KNOWN ALLERGIES.  PAST MEDICAL HISTORY: 1. Insulin-dependent diabetes. 2. Hypertension. 3. Prior tobacco abuse, he quit about 5 months ago. 4. Status post right femoral-popliteal bypass in April 2012, by Dr.     Hart Rochester.  HOME MEDICATIONS: 1. Lantus 32 units in the morning. 2. He takes NovoLog 10 units at the morning, 6-10 units with lunch,     and occasionally takes 5 units at bedtime according to a sliding     scale. 3. He takes metoprolol 50 mg daily. 4. Lisinopril 20 mg  daily. 5. Omeprazole 40 mg b.i.d. 6. Ibuprofen 400 mg daily. 7. Goody's Powder 2-3 packets a day. 8. He also takes an unknown dose of Vicodin every 4-6 hours for pain.  SOCIAL HISTORY:  He was a smoker of about 2 packs a day, he quit approximately 5 months ago after his fem-pop bypass.  He says he used to drink 1-2 beers a day; however, in the past 3 months has stopped given the burning in his stomach.  Denies any illicit drug use.  FAMILY HISTORY:  Significant for brain cancer in his mother.  REVIEW OF SYSTEMS:  Negative except as mentioned in the history of present illness.  PHYSICAL EXAMINATION:  VITAL SIGNS ON ADMISSION:  Blood pressure 134/73, heart rate 112, respirations 18, sats 98% on room air, and temperature of 97.8. GENERAL:  He is alert, awake, and oriented x3.  He does not appear to be in distress.  He does appear to be pale and with somewhat dry mucous membranes. HEENT:  Normocephalic and atraumatic.  Pupils are equal, round, and reactive to light.  He has intact extraocular movements.  Dry mucous membranes.  He does wear corrective lenses. NECK:  Supple.  No JVD.  No lymphadenopathy.  No bruits.  No goiter. CARDIOVASCULAR:  His heart is tachycardic, but with a regular rhythm.  I cannot auscultate murmurs, rubs, or gallops. RESPIRATORY:  His lungs appear clear to auscultation bilaterally. GI:  His abdomen is soft, nontender, and nondistended with positive bowel sounds. EXTREMITIES:  He has 1+ pitting edema on the right ankle, no edema on the left. NEUROLOGIC:  His neurologic exam is grossly intact and nonfocal.  LABORATORY DATA:  Labs from the PCPs office include a CBC with a WBC of 6.4, hemoglobin of 5.9, with an MCV of 65.3, and platelet count of 431.  ASSESSMENT AND PLAN: 1. Upper gastrointestinal bleed.  This is likely related to gastritis     versus peptic ulcer disease given his NSAID use.  At this moment, I     will place him on clear liquids and keep him  n.p.o. after midnight     for possible EGD.  Dr. Elnoria Howard with GI has been consulted and will see     the patient later today, and possibly plan for EGD in the morning. 2. Microcytic anemia.  This is likely secondary to his upper     gastrointestinal bleed and ongoing iron losses.  I will type and     cross and transfuse 2 units.  I will check CBCs every 8 hours.     Further transfusions depending on hemoglobin.  He will need to be     on iron supplementation once he is started on a p.o. diet.  For     now, we will check his iron stores. 3. For his insulin-dependent diabetes, given his n.p.o. status, I will     place him on half his normal dose of Lantus and sliding scale and     follow. 4. For his hypertension, we will continue his Lopressor and     lisinopril. 5. For DVT prophylaxis, he will be on SCDs.     Peggye Pitt, M.D.     EH/MEDQ  D:  08/15/2011  T:  08/15/2011  Job:  [id number]  cc:   Belva Agee, NP  Electronically Signed by Peggye Pitt M.D. on 08/25/2011 08:07:49 AM

## 2011-08-25 NOTE — Progress Notes (Signed)
[name], [name] NO.:  [id number]  MEDICAL RECORD NO.:  [id number]  LOCATION:  4710                         FACILITY:  [facility]  PHYSICIAN:  Peggye Pitt, M.D. DATE OF BIRTH:  02-27-1946                                PROGRESS NOTE   DATE OF DISCHARGE: Still to be determined.  PRIMARY CARE PHYSICIAN: Belva Agee, NP with [facility].  DIAGNOSES: Up to date include, 1. GI bleed of questionable source.  EGD and colonoscopy negative.     Capsule endoscopy results pending. 2. Acute blood loss anemia plus iron-deficiency anemia status post     four units of PRBCs with an initial hemoglobin of 5.9. 3. Gastroesophageal reflux disease, positive H. pylori antibody on     Prevpac. 4. Pulmonary edema improved with Lasix, echo pending. 5. Diabetes mellitus type 1 with multiple hypoglycemic episodes, now     with increased CBGs.  HOSPITAL COURSE: Up-to-date, [name] is a very pleasant 65 year old white man, admitted to the hospital on August 15, 2011, as a direct admission from his primary care physician's office, where he was noted to be very weak and pale.  A hemoglobin was drawn and was found to be 5.9.  Further review of systems noted, burning in his stomach and melanotic stools and he was referred to [place] for further evaluation.  He has been seen in consultation by Dr. Jeani Hawking, who has performed EGDs and colonoscopies both of which have been negative.  He had a capsule endoscopy, however, a first capsule did not progress past the stomach, so we had to have another EGD to further deploy the capsule into the duodenum.  Results of this latest capsule endoscopy are pending at this time.  His hemoglobin on admission was 5.9, which has increased to 9.7 with 4 units of PRBCs.  His hemoglobin has been stable for the past 3 days.  No further episodes of bleeding or melanotic stools.  He was very microcytic and iron deficient, so I  do believe that he will require ferrous sulfate tablets at discharge.  A H. pylori antibody was found to be positive in his serum.  Given the fact that he has never been treated before coupled with the symptoms, I have started him on treatment for this.  He did complain of a cough, so we went ahead and did a chest x-ray, which showed some pulmonary edema.  His cough has markedly improved with Lasix.  A 2-D echocardiogram has been ordered with results pending at this time.  Insulin-dependent diabetes mellitus.  Given the fact that he would be n.p.o. for studies, his insulin dose was cut down significantly. However, over his first 48 hours, he had multiple hypoglycemic episodes requiring total discontinuation of his insulin.  Now that he has started back on a diet.  His sugars have started to creep back up, so now we are up-titrating his Lantus and insulin.  DISPOSITION: I would anticipate his discharge date in 1-2 days following results of his 2-D echocardiogram and capsule endoscopy pending, no further episodes of bleeding.     Peggye Pitt, M.D.     EH/MEDQ  D:  08/20/2011  T:  08/20/2011  Job:  [id number]  Electronically Signed by Peggye Pitt M.D. on 08/25/2011 08:07:59 AM

## 2011-08-26 NOTE — Discharge Summary (Signed)
NAMETREMAIN, RUCINSKI NO.:  [id number]  MEDICAL RECORD NO.:  [id number]  LOCATION:  4710                         FACILITY:  [facility]  PHYSICIAN:  Kathlen Mody, MD       DATE OF BIRTH:  10-21-1946  DATE OF ADMISSION:  08/15/2011 DATE OF DISCHARGE:  08/21/2011                              DISCHARGE SUMMARY   PRIMARY CARE PHYSICIAN:  Dr. Belva Agee at [facility].  DISCHARGE DIAGNOSES: 1. Gastrointestinal bleed of unknown source.  EGD, colonoscopy and     capsule endoscopy results negative. 2. Acute blood loss anemia status post 4 units of packed red blood     cells came in with an initial hemoglobin of 5.9. 3. History of hiatal hernia. 4. Gastroesophageal reflux disease.  Positive H. pylori antibody and     prophylax. 5. Newly diagnosed systolic heart failure with an echo on August 20, 2011, showing an ejection fraction of 45-50%. 6. Diabetes mellitus, on insulin. 7. Hypertension. 8. History of tobacco abuse. 9. History of right femoral-popliteal bypass in April 2012 by Dr.     Hart Rochester.  CONSULTATION:  Gastrointestinal consult from Dr. Elnoria Howard.  PROCEDURES DONE:  The patient had a colonoscopy/endoscopy,and a capsule endoscopy done.  PERTINENT LABS:  The patient had a basic metabolic panel.  On admission came in with a glucose of 67.  CBC showed a hemoglobin of 8 with hematocrit of 70.4.  Helicobacter pylori antibody of 1.96.  On the day of discharge, the patient's basic metabolic panel significant for a glucose of 325, CBC with a hemoglobin of 9.8, hematocrit of 32.1.  RADIOLOGY:  The patient had a two-view chest x-ray that shows mild to moderate acute congestive heart failure with small bilateral pleural effusions and bibasilar atelectasis, COPD.  BRIEF HOSPITAL COURSE:  This is a pleasant 65 year old gentleman admitted to hospital on August 15, 2011, for anemia with a hemoglobin of 5.9, also found to have melanotic stools.   Initially gastrointestinal consult from Dr. Elnoria Howard was obtained who did upper EGD and colonoscopy which have been negative.  He subsequently had a capsule endoscopy which did not show any source of bleeding.  On EGD he was found to have an hiatal hernia, also had a H. pylori antibody testing which came positive and given the fact that he was never treated I have started him on clarithromycin and amoxicillin for a 2-week treatment.  During his hospitalization received about 4 units of blood and 2 L of fluid after which he developed some shortness of breath and cough.  A chest x-ray was done which showed some pulmonary edema and he was started on IV Lasix and his cough and breathing has markedly improved.  A 2-D echocardiogram was ordered which showed mild systolic heart failure with an ejection fraction of 45-50% with diffuse hypokinesis.  Cardiology consultation was recommended but the patient wanted to be discharged home and he requested that he would follow up with Cardiology as an outpatient.  Since his cough and shortness of breath have resolved, I called [facility] and set him up an appointment with Dr. Myrtis Ser on October 9 at 11:15 for to evaluate  him for his heart failure.  He is being discharged on 40 mg of Lasix daily.  Insulin-dependent diabetes.  He is on Lantus of 12 units and sliding scale, will continue the same.  On the day of discharge, the patient's vitals include temperature 97.9, pulse was 78, respirations 20, blood pressure 100/60, saturating 95% on room air.  His exam was within normal limits.  He is being discharged home and recommended to follow up with his primary care physician about a week to check on his hemoglobin and transfusions as needed and also to follow up with his cardiologist Dr. Myrtis Ser in about 1 week to evaluate for his heart failure.          ______________________________ Kathlen Mody, MD     VA/MEDQ  D:  08/21/2011  T:  08/21/2011  Job:   [id number]  Electronically Signed by Kathlen Mody MD on 08/26/2011 11:14:45 PM

## 2011-08-27 ENCOUNTER — Encounter: Payer: Self-pay | Admitting: Cardiovascular Disease

## 2011-08-27 ENCOUNTER — Ambulatory Visit: Payer: Medicare PPO | Admitting: Cardiology

## 2011-08-27 ENCOUNTER — Ambulatory Visit (INDEPENDENT_AMBULATORY_CARE_PROVIDER_SITE_OTHER): Payer: Medicare PPO | Admitting: Cardiovascular Disease

## 2011-08-27 DIAGNOSIS — R0609 Other forms of dyspnea: Secondary | ICD-10-CM

## 2011-08-27 DIAGNOSIS — E119 Type 2 diabetes mellitus without complications: Secondary | ICD-10-CM | POA: Insufficient documentation

## 2011-08-27 DIAGNOSIS — I10 Essential (primary) hypertension: Secondary | ICD-10-CM | POA: Insufficient documentation

## 2011-08-27 DIAGNOSIS — I509 Heart failure, unspecified: Secondary | ICD-10-CM | POA: Insufficient documentation

## 2011-08-27 DIAGNOSIS — I739 Peripheral vascular disease, unspecified: Secondary | ICD-10-CM | POA: Insufficient documentation

## 2011-08-27 LAB — BASIC METABOLIC PANEL
BUN: 17 mg/dL (ref 6–23)
Chloride: 103 mEq/L (ref 96–112)
Potassium: 3.8 mEq/L (ref 3.5–5.1)
Sodium: 141 mEq/L (ref 135–145)

## 2011-08-27 LAB — BRAIN NATRIURETIC PEPTIDE: Pro B Natriuretic peptide (BNP): 594 pg/mL — ABNORMAL HIGH (ref 0.0–100.0)

## 2011-08-27 MED ORDER — LOSARTAN POTASSIUM 50 MG PO TABS: 50.0000 mg | ORAL_TABLET | Freq: Every day | ORAL | Status: DC

## 2011-08-27 NOTE — Assessment & Plan Note (Signed)
Target A1c 6.5 or less with vascular disease

## 2011-08-27 NOTE — Assessment & Plan Note (Signed)
Doubt ischemic with normal myovue 11/11 and normal ECG. Cardiac MRI to quantitate EF and R/O scar

## 2011-08-27 NOTE — Assessment & Plan Note (Signed)
Has cough.  Change ACE to ARB Cozaar 50mg 

## 2011-08-27 NOTE — Assessment & Plan Note (Signed)
F/U Dr Hart Rochester and Imogene Burn.  Symptoms progressive on right.  Not sure what sharp pain behind right knee is No bakers cyst on exam

## 2011-08-27 NOTE — Patient Instructions (Signed)
Your physician wants you to follow-up in: 6 MONTHS You will receive a reminder letter in the mail two months in advance. If you don't receive a letter, please call our office to schedule the follow-up appointment.   Your physician has requested that you have a cardiac MRI. Cardiac MRI uses a computer to create images of your heart as its beating, producing both still and moving pictures of your heart and major blood vessels. For further information please visit [email or web address]. Please follow the instruction sheet given to you today for more information.   Your physician recommends that you return for lab work in: TODAY  STOP LISINOPRIL  START LOSARTAN 50 MG ONE TABLET ONCE DAILY

## 2011-08-27 NOTE — Progress Notes (Signed)
65 yo referred by NP Weeks at [facility].  Hospitalized for gi bleed last month with Hb5.2.  In hospital had echo with EF 45-50%.  No clinical CHF.  Started on ACE and diuretic.  More coughing since on ACE.  No history of CAD.  Normal myovue 11/11.  Normal ECG.  Has vascular disease wit previous right fem-pop by Betti Cruz and seeing Imogene Burn now for possible percutaneous intervention on right.  Has pain behind right knee when bending and claudication more in RLE.  No clincial CHF.  Volume normal and no SSCP, PND, orthopnea, edema.  CRF; DM under good control.  Compliant with meds.  Reviewed labs from [facility] and Hct over 35 now.  ROS: Denies fever, malais, weight loss, blurry vision, decreased visual acuity, cough, sputum, SOB, hemoptysis, pleuritic pain, palpitaitons, heartburn, abdominal pain, melena, lower extremity edema, claudication, or rash.  All other systems reviewed and negative   General: Affect appropriate Healthy:  appears stated age HEENT: normal Neck supple with no adenopathy JVP normal no bruits no thyromegaly Lungs clear with no wheezing and good diaphragmatic motion Heart:  S1/S2 no murmur,rub, gallop or click PMI normal Abdomen: benighn, BS positve, no tenderness, no AAA Right fem pop with femoral bruit  No HSM or HJR Distal pulses intact with no bruits No edema Neuro non-focal Skin warm and dry No muscular weakness  Medications Current Outpatient Prescriptions  Medication Sig Dispense Refill  . amoxicillin (AMOXIL) 500 MG capsule Take 500 mg by mouth 2 (two) times daily.        . cholecalciferol (VITAMIN D) 1000 UNITS tablet Take 1,000 Units by mouth daily.        . clarithromycin (BIAXIN XL) 500 MG 24 hr tablet Take 1,000 mg by mouth 2 (two) times daily.        . ferrous sulfate 325 (65 FE) MG EC tablet Take 325 mg by mouth 2 (two) times daily.        . fluticasone (FLONASE) 50 MCG/ACT nasal spray Place 2 sprays into the nose daily.        . furosemide (LASIX) 40 MG tablet Take  40 mg by mouth daily.        Marland Kitchen HYDROcodone-acetaminophen (NORCO) 10-325 MG per tablet Take 1 tablet by mouth every 6 (six) hours as needed.        . insulin aspart (NOVOLOG) 100 UNIT/ML injection Inject into the skin as directed.       . insulin glargine (LANTUS) 100 UNIT/ML injection Inject 35 Units into the skin as directed.       . metoprolol (LOPRESSOR) 50 MG tablet Take 50 mg by mouth daily.       Marland Kitchen omeprazole (PRILOSEC) 40 MG capsule Take 40 mg by mouth 2 (two) times daily.       Marland Kitchen losartan (COZAAR) 50 MG tablet Take 1 tablet (50 mg total) by mouth daily.  30 tablet  12    Allergies Review of patient's allergies indicates no known allergies.  Family History: No family history on file.  Social History: History   Social History  . Marital Status: Widowed    Spouse Name: N/A    Number of Children: N/A  . Years of Education: N/A   Occupational History  . Not on file.   Social History Main Topics  . Smoking status: Former Smoker -- 3.0 packs/day    Types: Cigarettes    Quit date: 01/16/2010  . Smokeless tobacco: Not on file  . Alcohol Use:  7.2 oz/week    12 Cans of beer per week  . Drug Use:   . Sexually Active:    Other Topics Concern  . Not on file   Social History Narrative  . No narrative on file    Electrocardiogram:  8/12 copy from [facility] NSR 72 normal ECG No change from 02/21/11  Assessment and Plan

## 2011-08-28 ENCOUNTER — Telehealth: Payer: Self-pay | Admitting: Cardiovascular Disease

## 2011-08-28 NOTE — Telephone Encounter (Signed)
Spoke with pt dtr, questions regarding labs answered Deliah Goody

## 2011-08-28 NOTE — Telephone Encounter (Signed)
Pt daughter wanting to call and check the status of pt lab results.   BMET: ? Also wants to know what level was high? Pt was told someone to call him to schedule MRI. Pt has been waiting for phone call.  Please return pt daughter call to discuss further.

## 2011-08-30 ENCOUNTER — Telehealth: Payer: Self-pay | Admitting: Cardiovascular Disease

## 2011-08-30 NOTE — Telephone Encounter (Signed)
Pt daughter calling wanting to know when pt can schedule appt for follow up appt after MRI. Please return call to discuss further.

## 2011-08-30 NOTE — Telephone Encounter (Signed)
Pt daughter, Marcelino Duster, to schedule follow-up appt after MRI (scheduled for 09/10/11 at 3pm).  She says it was also to discuss medication adjustments and results. Appt made for 09/17/11.  Mylo Red RN

## 2011-09-03 ENCOUNTER — Telehealth: Payer: Self-pay | Admitting: Cardiovascular Disease

## 2011-09-10 ENCOUNTER — Ambulatory Visit (HOSPITAL_COMMUNITY)
Admission: RE | Admit: 2011-09-10 | Discharge: 2011-09-10 | Disposition: A | Discharge status: Home / Self Care | Payer: Medicare PPO | Source: Ambulatory Visit | Attending: Cardiovascular Disease | Admitting: Cardiovascular Disease

## 2011-09-10 DIAGNOSIS — I428 Other cardiomyopathies: Secondary | ICD-10-CM | POA: Insufficient documentation

## 2011-09-10 DIAGNOSIS — I739 Peripheral vascular disease, unspecified: Secondary | ICD-10-CM

## 2011-09-10 DIAGNOSIS — E119 Type 2 diabetes mellitus without complications: Secondary | ICD-10-CM

## 2011-09-10 DIAGNOSIS — I509 Heart failure, unspecified: Secondary | ICD-10-CM

## 2011-09-10 MED ORDER — GADOBENATE DIMEGLUMINE 529 MG/ML IV SOLN
20.0000 mL | Freq: Once | INTRAVENOUS | Status: AC
  Administered 2011-09-10: 20 mL via INTRAVENOUS

## 2011-09-17 ENCOUNTER — Encounter: Payer: Self-pay | Admitting: *Deleted

## 2011-09-17 ENCOUNTER — Encounter: Payer: Self-pay | Admitting: Cardiovascular Disease

## 2011-09-17 ENCOUNTER — Ambulatory Visit (INDEPENDENT_AMBULATORY_CARE_PROVIDER_SITE_OTHER): Payer: Medicare PPO | Admitting: Cardiovascular Disease

## 2011-09-17 DIAGNOSIS — I509 Heart failure, unspecified: Secondary | ICD-10-CM

## 2011-09-17 DIAGNOSIS — I739 Peripheral vascular disease, unspecified: Secondary | ICD-10-CM

## 2011-09-17 DIAGNOSIS — I10 Essential (primary) hypertension: Secondary | ICD-10-CM

## 2011-09-17 MED ORDER — CARVEDILOL 3.125 MG PO TABS: 3.1250 mg | ORAL_TABLET | Freq: Two times a day (BID) | ORAL | Status: DC

## 2011-09-17 MED ORDER — LOSARTAN POTASSIUM-HCTZ 100-12.5 MG PO TABS: 1.0000 | ORAL_TABLET | Freq: Every day | ORAL | Status: DC

## 2011-09-17 NOTE — Patient Instructions (Signed)
Your physician wants you to follow-up in: 3 MONTHS You will receive a reminder letter in the mail two months in advance. If you don't receive a letter, please call our office to schedule the follow-up appointment.   STOP LOSARTAN  START LOSARTAN/HCTZ 100-12.5 MG ONE TABLET ONCE DAILY  START CARVEDILOL 3.125 MG ONE TABLET TWICE DAILY  START ASPIRIN 81 MG ONCE DAILY

## 2011-09-17 NOTE — Assessment & Plan Note (Signed)
F/U Dr Hart Rochester.  Restart ASA

## 2011-09-17 NOTE — Progress Notes (Signed)
65 yo referred by NP Weeks at [facility]. Hospitalized for gi bleed last month with Hb5.2. In hospital had echo with EF 45-50%. No clinical CHF. Started on ACE and diuretic. More coughing since on ACE. No history of CAD. Normal myovue 11/11. Normal ECG. Has vascular disease wit previous right fem-pop by Betti Cruz and seeing Imogene Burn now for possible percutaneous intervention on right. Has pain behind right knee when bending and claudication more in RLE. No clincial CHF. Volume normal and no SSCP, PND, orthopnea, edema. CRF; DM under good control. Compliant with meds. Reviewed labs from [facility] and Hct over 35 now.   Reviewed cardiac MRI from 10/9 and EF was 35% with no scar tissue  ROS: Denies fever, malais, weight loss, blurry vision, decreased visual acuity, cough, sputum, SOB, hemoptysis, pleuritic pain, palpitaitons, heartburn, abdominal pain, melena, lower extremity edema, claudication, or rash.  All other systems reviewed and negative  General: Affect appropriate Healthy:  appears stated age HEENT: normal Neck supple with no adenopathy JVP normal no bruits no thyromegaly Lungs clear with no wheezing and good diaphragmatic motion Heart:  S1/S2 no murmur,rub, gallop or click PMI normal Abdomen: benighn, BS positve, no tenderness, no AAA no bruit.  No HSM or HJR Distal pulses intact  Only plus one on RLE Plus one RLE  edema Neuro non-focal Skin warm and dry No muscular weakness   Current Outpatient Prescriptions  Medication Sig Dispense Refill  . cholecalciferol (VITAMIN D) 1000 UNITS tablet Take 1,000 Units by mouth daily.        . fluticasone (FLONASE) 50 MCG/ACT nasal spray Place 2 sprays into the nose daily.        Marland Kitchen HYDROcodone-acetaminophen (NORCO) 10-325 MG per tablet Take 1 tablet by mouth every 6 (six) hours as needed.        . insulin aspart (NOVOLOG) 100 UNIT/ML injection Inject into the skin as directed.       . insulin glargine (LANTUS) 100 UNIT/ML injection Inject 35 Units into the  skin as directed.       Marland Kitchen losartan (COZAAR) 50 MG tablet Take 1 tablet (50 mg total) by mouth daily.  30 tablet  12  . omeprazole (PRILOSEC) 40 MG capsule Take 40 mg by mouth 2 (two) times daily.         Allergies  Review of patient's allergies indicates no known allergies.  Electrocardiogram:  Assessment and Plan

## 2011-09-17 NOTE — Assessment & Plan Note (Signed)
Well controlled.  Continue current medications and low sodium Dash type diet.    

## 2011-09-17 NOTE — Assessment & Plan Note (Signed)
Functional class one.  Increase ARB and change diuretic to HCTZ  Add low dose BB.  Echo in 6 months

## 2011-10-07 ENCOUNTER — Encounter: Payer: Self-pay | Admitting: Vascular Surgery

## 2011-10-08 ENCOUNTER — Encounter: Payer: Self-pay | Admitting: Vascular Surgery

## 2011-10-08 ENCOUNTER — Encounter (HOSPITAL_COMMUNITY): Payer: Self-pay | Admitting: Pharmacy Technician

## 2011-10-08 ENCOUNTER — Other Ambulatory Visit (INDEPENDENT_AMBULATORY_CARE_PROVIDER_SITE_OTHER): Payer: Medicare PPO | Admitting: *Deleted

## 2011-10-08 ENCOUNTER — Ambulatory Visit (INDEPENDENT_AMBULATORY_CARE_PROVIDER_SITE_OTHER): Payer: Medicare PPO | Admitting: Vascular Surgery

## 2011-10-08 ENCOUNTER — Other Ambulatory Visit: Payer: Self-pay

## 2011-10-08 ENCOUNTER — Ambulatory Visit (INDEPENDENT_AMBULATORY_CARE_PROVIDER_SITE_OTHER): Payer: Medicare PPO | Admitting: *Deleted

## 2011-10-08 VITALS — BP 146/82 | HR 75 | Resp 16 | Ht 71.0 in | Wt 168.0 lb

## 2011-10-08 DIAGNOSIS — Z95828 Presence of other vascular implants and grafts: Secondary | ICD-10-CM

## 2011-10-08 DIAGNOSIS — I739 Peripheral vascular disease, unspecified: Secondary | ICD-10-CM

## 2011-10-08 DIAGNOSIS — Z48812 Encounter for surgical aftercare following surgery on the circulatory system: Secondary | ICD-10-CM

## 2011-10-08 DIAGNOSIS — I70219 Atherosclerosis of native arteries of extremities with intermittent claudication, unspecified extremity: Secondary | ICD-10-CM

## 2011-10-08 DIAGNOSIS — Z9889 Other specified postprocedural states: Secondary | ICD-10-CM

## 2011-10-08 NOTE — Progress Notes (Signed)
Subjective:     Patient ID: [name], male   DOB: 10/19/1946, 65 y.o.   MRN: [id number]  HPI this 65-year-old male had a right femoral-popliteal bypass graft performed in April of 2012 for severe claudication. His symptoms were relieved but he has had some chronic edema in the right leg which has persisted. He has been noted to have a vein graft stenosis in the midportion of the femoral popliteal bypass on 2 duplex scans. Today the velocity is 424 cm/s and a small focal area in the midportion of the bypass. This correlates with where an end to end anastomosis was done after resecting this short segment of small caliber vein. This may be an anastomotic stenosis where the vein was anastomosed to the other vein. Patient denies claudication symptoms or rest pain or nonhealing ulcers. There was discussion of an angiogram performed in August by Dr. Chien but it was decided to wait to see if this area persisted which he has.  Past Medical History  Diagnosis Date  . 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 Arthritis   . Reflux   . Constipation 02/12/11  . Leg pain 02/12/11    with walking  . Joint pain 02/12/11    right shoulder  . Hypertension   . Anemia   . Peripheral vascular disease     History  Substance Use Topics  . Smoking status: Former Smoker -- 3.0 packs/day    Types: Cigarettes    Quit date: 01/16/2010  . Smokeless tobacco: Not on file  . Alcohol Use: 7.2 oz/week    12 Cans of beer per week    History reviewed. No pertinent family history.  No Known Allergies  Current outpatient prescriptions:aspirin EC 81 MG tablet, Take 1 tablet (81 mg total) by mouth daily., Disp: 150 tablet, Rfl: 2;  carvedilol (COREG) 3.125 MG tablet, Take 1 tablet (3.125 mg total) by mouth 2 (two) times daily., Disp: 60 tablet, Rfl: 11;  cholecalciferol (VITAMIN D) 1000 UNITS tablet, Take 1,000 Units by mouth daily.  , Disp: , Rfl: ;  fluticasone (FLONASE) 50 MCG/ACT nasal spray, Place 2 sprays  into the nose daily.  , Disp: , Rfl:  HYDROcodone-acetaminophen (NORCO) 10-325 MG per tablet, Take 1 tablet by mouth every 6 (six) hours as needed.  , Disp: , Rfl: ;  insulin aspart (NOVOLOG) 100 UNIT/ML injection, Inject into the skin as directed. , Disp: , Rfl: ;  insulin glargine (LANTUS) 100 UNIT/ML injection, Inject 35 Units into the skin as directed. , Disp: , Rfl:  losartan-hydrochlorothiazide (HYZAAR) 100-12.5 MG per tablet, Take 1 tablet by mouth daily., Disp: 30 tablet, Rfl: 12;  omeprazole (PRILOSEC) 40 MG capsule, Take 40 mg by mouth 2 (two) times daily. , Disp: , Rfl:   BP 146/82  Pulse 75  Resp 16  Ht 5' 11" (1.803 m)  Wt 168 lb (76.204 kg)  BMI 23.43 kg/m2  SpO2 99%  Body mass index is 23.43 kg/(m^2).         Review of Systems he denies chest pain, dyspnea on exertion, PND, orthopnea. He did have some anemia recently which was unexplained. He denies neurologic symptoms such as lateralizing weakness, aphasia, amaurosis fugax, diplopia, blurred vision, and syncope.     Objective:   Physical Exam blood pressure 146/82 heart rate 75 respirations 16 HEENT normal for age General he is a well-developed well-nourished male no apparent distress Lungs no rhonchi or wheezing Cardiovascular regular rhythm no murmurs   rubs pulses 3+ no bruits audible Neurologic exam normal Abdomen soft nontender with no masses Works remedy 3+ femoral 2+ popliteal pulse on the right 3+ femoral 2+ popliteal pulse on the left there is 1+ edema on the right from the mid calf distally. Musculoskeletal free major deformities   Today I ordered a duplex scan of the right femoral-popliteal vein graft which are reviewed and interpreted. ABI has decreased slightly to 0.90 from 0.97. There continues to be an area of thickened focal stenosis in the midportion of the right femoral-popliteal vein graft     Assessment:    right femoral-popliteal vein graft stenosis-asymptomatic    Plan:     Angiography by  Dr. Chien on November 29 with possible PTA of vein graft if appropriate or possible surgical revision if necessary  

## 2011-10-17 ENCOUNTER — Encounter (HOSPITAL_COMMUNITY)
Admission: RE | Disposition: A | Discharge status: Home / Self Care | Payer: Self-pay | Source: Ambulatory Visit | Attending: Vascular Surgery

## 2011-10-17 ENCOUNTER — Ambulatory Visit (HOSPITAL_COMMUNITY)
Admission: RE | Admit: 2011-10-17 | Discharge: 2011-10-17 | Disposition: A | Discharge status: Home / Self Care | Payer: Medicare PPO | Source: Ambulatory Visit | Attending: Vascular Surgery | Admitting: Vascular Surgery

## 2011-10-17 DIAGNOSIS — Y832 Surgical operation with anastomosis, bypass or graft as the cause of abnormal reaction of the patient, or of later complication, without mention of misadventure at the time of the procedure: Secondary | ICD-10-CM | POA: Insufficient documentation

## 2011-10-17 DIAGNOSIS — I10 Essential (primary) hypertension: Secondary | ICD-10-CM | POA: Insufficient documentation

## 2011-10-17 DIAGNOSIS — E119 Type 2 diabetes mellitus without complications: Secondary | ICD-10-CM | POA: Insufficient documentation

## 2011-10-17 DIAGNOSIS — I708 Atherosclerosis of other arteries: Secondary | ICD-10-CM

## 2011-10-17 DIAGNOSIS — I70219 Atherosclerosis of native arteries of extremities with intermittent claudication, unspecified extremity: Secondary | ICD-10-CM

## 2011-10-17 DIAGNOSIS — I70209 Unspecified atherosclerosis of native arteries of extremities, unspecified extremity: Secondary | ICD-10-CM | POA: Insufficient documentation

## 2011-10-17 DIAGNOSIS — K219 Gastro-esophageal reflux disease without esophagitis: Secondary | ICD-10-CM | POA: Insufficient documentation

## 2011-10-17 DIAGNOSIS — T82898A Other specified complication of vascular prosthetic devices, implants and grafts, initial encounter: Secondary | ICD-10-CM

## 2011-10-17 DIAGNOSIS — Z794 Long term (current) use of insulin: Secondary | ICD-10-CM | POA: Insufficient documentation

## 2011-10-17 DIAGNOSIS — M129 Arthropathy, unspecified: Secondary | ICD-10-CM | POA: Insufficient documentation

## 2011-10-17 HISTORY — PX: LOWER EXTREMITY ANGIOGRAM: SHX5508

## 2011-10-17 HISTORY — PX: ABDOMINAL ANGIOGRAM: SHX5499

## 2011-10-17 LAB — POCT I-STAT, CHEM 8
Creatinine, Ser: 0.8 mg/dL (ref 0.50–1.35)
Glucose, Bld: 217 mg/dL — ABNORMAL HIGH (ref 70–99)
Hemoglobin: 12.9 g/dL — ABNORMAL LOW (ref 13.0–17.0)
TCO2: 24 mmol/L (ref 0–100)

## 2011-10-17 LAB — GLUCOSE, CAPILLARY: Glucose-Capillary: 161 mg/dL — ABNORMAL HIGH (ref 70–99)

## 2011-10-17 LAB — POCT ACTIVATED CLOTTING TIME: Activated Clotting Time: 171 seconds

## 2011-10-17 SURGERY — ANGIOGRAM, LOWER EXTREMITY
Anesthesia: LOCAL | Laterality: Right

## 2011-10-17 MED ORDER — CLOPIDOGREL BISULFATE 75 MG PO TABS: 75.0000 mg | ORAL_TABLET | Freq: Every day | ORAL | Status: DC

## 2011-10-17 MED ORDER — LIDOCAINE HCL (PF) 1 % IJ SOLN
INTRAMUSCULAR | Status: AC
  Filled 2011-10-17: qty 30

## 2011-10-17 MED ORDER — SODIUM CHLORIDE 0.9 % IV SOLN: 1.0000 mL/kg/h | INTRAVENOUS | Status: DC

## 2011-10-17 MED ORDER — HEPARIN SODIUM (PORCINE) 1000 UNIT/ML IJ SOLN
INTRAMUSCULAR | Status: AC
  Filled 2011-10-17: qty 1

## 2011-10-17 MED ORDER — SODIUM CHLORIDE 0.9 % IV SOLN
INTRAVENOUS | Status: DC
  Administered 2011-10-17: 06:00:00 via INTRAVENOUS

## 2011-10-17 MED ORDER — HEPARIN (PORCINE) IN NACL 2-0.9 UNIT/ML-% IJ SOLN
INTRAMUSCULAR | Status: AC
  Filled 2011-10-17: qty 1000

## 2011-10-17 NOTE — Interval H&P Note (Signed)
--    [facility] [place]  History and Physical Update  The patient was interviewed and re-examined.  The patient's History and Physical has been reviewed and is unchanged.  There is no change in the plan of care.  Leonides Sake, MD [facility] [place] Office: [phone] Pager: [phone]  10/17/2011, 7:21 AM

## 2011-10-17 NOTE — H&P (View-Only) (Signed)
Subjective:     Patient ID: [name], male   DOB: [birth year], 64 y.o.   MRN: [id number]  HPI this 65 year old male had a right femoral-popliteal bypass graft performed in April of 2012 for severe claudication. His symptoms were relieved but he has had some chronic edema in the right leg which has persisted. He has been noted to have a vein graft stenosis in the midportion of the femoral popliteal bypass on 2 duplex scans. Today the velocity is 424 cm/s and a small focal area in the midportion of the bypass. This correlates with where an end to end anastomosis was done after resecting this short segment of small caliber vein. This may be an anastomotic stenosis where the vein was anastomosed to the other vein. Patient denies claudication symptoms or rest pain or nonhealing ulcers. There was discussion of an angiogram performed in August by Dr. Standley Brooking but it was decided to wait to see if this area persisted which he has.  Past Medical History  Diagnosis Date  . Diabetes mellitus age 38    insulin dependent  . Arthritis   . Reflux   . Constipation 02/12/11  . Leg pain 02/12/11    with walking  . Joint pain 02/12/11    right shoulder  . Hypertension   . Anemia   . Peripheral vascular disease     History  Substance Use Topics  . Smoking status: Former Smoker -- 3.0 packs/day    Types: Cigarettes    Quit date: 01/16/2010  . Smokeless tobacco: Not on file  . Alcohol Use: 7.2 oz/week    12 Cans of beer per week    History reviewed. No pertinent family history.  No Known Allergies  Current outpatient prescriptions:aspirin EC 81 MG tablet, Take 1 tablet (81 mg total) by mouth daily., Disp: 150 tablet, Rfl: 2;  carvedilol (COREG) 3.125 MG tablet, Take 1 tablet (3.125 mg total) by mouth 2 (two) times daily., Disp: 60 tablet, Rfl: 11;  cholecalciferol (VITAMIN D) 1000 UNITS tablet, Take 1,000 Units by mouth daily.  , Disp: , Rfl: ;  fluticasone (FLONASE) 50 MCG/ACT nasal spray, Place 2 sprays  into the nose daily.  , Disp: , Rfl:  HYDROcodone-acetaminophen (NORCO) 10-325 MG per tablet, Take 1 tablet by mouth every 6 (six) hours as needed.  , Disp: , Rfl: ;  insulin aspart (NOVOLOG) 100 UNIT/ML injection, Inject into the skin as directed. , Disp: , Rfl: ;  insulin glargine (LANTUS) 100 UNIT/ML injection, Inject 35 Units into the skin as directed. , Disp: , Rfl:  losartan-hydrochlorothiazide (HYZAAR) 100-12.5 MG per tablet, Take 1 tablet by mouth daily., Disp: 30 tablet, Rfl: 12;  omeprazole (PRILOSEC) 40 MG capsule, Take 40 mg by mouth 2 (two) times daily. , Disp: , Rfl:   BP 146/82  Pulse 75  Resp 16  Ht 5\' 11"  (1.803 m)  Wt 168 lb (76.204 kg)  BMI 23.43 kg/m2  SpO2 99%  Body mass index is 23.43 kg/(m^2).         Review of Systems he denies chest pain, dyspnea on exertion, PND, orthopnea. He did have some anemia recently which was unexplained. He denies neurologic symptoms such as lateralizing weakness, aphasia, amaurosis fugax, diplopia, blurred vision, and syncope.     Objective:   Physical Exam blood pressure 146/82 heart rate 75 respirations 16 HEENT normal for age General he is a well-developed well-nourished male no apparent distress Lungs no rhonchi or wheezing Cardiovascular regular rhythm no murmurs  rubs pulses 3+ no bruits audible Neurologic exam normal Abdomen soft nontender with no masses Works remedy 3+ femoral 2+ popliteal pulse on the right 3+ femoral 2+ popliteal pulse on the left there is 1+ edema on the right from the mid calf distally. Musculoskeletal free major deformities   Today I ordered a duplex scan of the right femoral-popliteal vein graft which are reviewed and interpreted. ABI has decreased slightly to 0.90 from 0.97. There continues to be an area of thickened focal stenosis in the midportion of the right femoral-popliteal vein graft     Assessment:    right femoral-popliteal vein graft stenosis-asymptomatic    Plan:     Angiography by  Dr. Standley Brooking on November 29 with possible PTA of vein graft if appropriate or possible surgical revision if necessary

## 2011-10-17 NOTE — Op Note (Signed)
OPERATIVE NOTE   PROCEDURE: 1.  Left common femoral artery cannulation under ultrasound guidance 2.  Aortogram 3.  R leg runoff 4.  Right fem-pop graft cutting angioplasty  PRE-OPERATIVE DIAGNOSIS:  Right femoropopliteal bypass graft stenosis  POST-OPERATIVE DIAGNOSIS: same as above   SURGEON: Leonides Sake, MD  ANESTHESIA: conscious sedation  ESTIMATED BLOOD LOSS: 30 cc  CONTRAST: 75 cc  FINDING(S): 1. Patent aorta  2. Patent bilateral renal arteries 3. Patent bilateral common, internal and external iliac arteries.  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 Patent right common and profunda femoral arteries 5. Occluded right superficial femoral artery  6. Widely patent below the knee popliteal artery 7. Two vessel runoff to the right foot 8. Focal stenosis in right common femoral artery to below the knee popliteal artery vein bypass graft: 90% -> <30% s/p angioplasty  9. Widely patent runoff after intervention  SPECIMEN(S):  none  INDICATIONS:   [name] is a 65 y.o. male who  presents with focal stenosis in right femoropopliteal bypass graft.  Dr. Hart Rochester recommended intervention in an assisted patency fashion to try to maintain patency of the bypass graft.  I discussed with the patient the nature of angiographic procedures, especially the limited patencies of any endovascular intervention.  The patient is aware of that the risks of an angiographic procedure include but are not limited to: bleeding, infection, access site complications, renal failure, embolization, rupture of vessel, dissection, possible need for emergent surgical intervention, possible need for surgical procedures to treat the patient's pathology, and stroke and death.  The patient is aware of the risks and agrees to proceed.  DESCRIPTION: After full informed consent was obtained from the patient, the patient was brought back to the angiography suite.  The patient was placed supine upon the angiography  table and connected to monitoring equipment.  The patient was then given conscious sedation, the amounts of which are documented in the patient's chart.  The patient was prepped and drape in the standard fashion for an angiographic procedure.  At this point, attention was turned to the left groin.  Under ultrasound guidance, the left common femoral artery will be cannulated with a 18 gauge needle.  The [facility] wire was passed up into the aorta.  The needle was exchanged for a 5-Fr sheath, which was advanced over the wire into the common femoral artery.  The dilator was then removed.  The Omniflush catheter was then loaded over the wire up to the level of L1.  The catheter was connected to the power injector circuit.  After de-airring and de-clotting the circuit, a power injector aortogram was completed.  The [facility] wire was replaced in the catheter, and using the [place] and Omniflush catheter, the right common iliac artery was selected.  The catheter and wire were advanced into the external iliac artery.  An automated right leg runoff was then completed.  Based on the findings above, intervention was indicated on the focal stenosis.  The wire was exchanged for a Rosen wire.  The patient's left femoral sheath was exchanged for a 6-Fr Ansel sheath, which was lodged in the right common femoral artery.  The dilator was removed.  The patient was given a total of 8000 units of Heparin intravenously to obtain anticoagulation.  The wire was exchanged out for a 0.014" Spartacore wire which I lodged into the above knee popliteal artery.  A 4 mm x 20 mm Angiosculpt balloon was advanced into the vein bypass graft, centered around the  stenosis in the vein graft.  The balloon was inflated to nominal pressure at 8 atm for 1 minute, inflating slowly.  The balloon was deflated and removed.  Completion hand injections demonstrated <30% residual stenosis and no evidence of distal embolization.  I felt this was an acceptable outcome  with no complication, so I removed the wire and pulled the sheath back into the left external iliac artery.  The left sheath was aspirated, without any clot.  I then loaded heparinized saline into the left sheath.  The plan is to pull the sheath in the holding area once Heparin has reversed.  COMPLICATIONS: none  CONDITION: stable  Leonides Sake, MD [facility] [place] Office: [phone] Pager: [phone]  10/17/2011, 8:28 AM

## 2011-10-21 NOTE — Procedures (Unsigned)
BYPASS GRAFT EVALUATION  INDICATION:  Followup right lower extremity graft  HISTORY: Diabetes:  Yes Cardiac:  No Hypertension:  Yes Smoking:  Previous Previous Surgery:  Right common femoral to below knee popliteal graft 03/06/2011  SINGLE LEVEL ARTERIAL EXAM                              RIGHT              LEFT Brachial: Anterior tibial: Posterior tibial: Peroneal: Ankle/brachial index:  PREVIOUS ABI:  Date:  07/12/2011  RIGHT:  0.97  LEFT:  1.19  LOWER EXTREMITY BYPASS GRAFT DUPLEX EXAM:  DUPLEX:  Patent right lower extremity graft with elevated velocities in the mid thigh suggestive of >75% stenosis.  Unable to determine plaque from possible sclerotic valve cusps.  IMPRESSION:  Patent right lower extremity graft with >75% stenosis in the mid thigh.  ___________________________________________ Quita Skye. Hart Rochester, M.D.  LT/MEDQ  D:  10/08/2011  T:  10/08/2011  Job:  [id number]

## 2011-11-21 ENCOUNTER — Encounter: Payer: Self-pay | Admitting: Vascular Surgery

## 2011-11-22 ENCOUNTER — Encounter: Payer: Self-pay | Admitting: Vascular Surgery

## 2011-11-22 ENCOUNTER — Ambulatory Visit (INDEPENDENT_AMBULATORY_CARE_PROVIDER_SITE_OTHER): Payer: Medicare PPO | Admitting: Vascular Surgery

## 2011-11-22 VITALS — BP 128/79 | HR 87 | Resp 20 | Ht 71.0 in | Wt 174.0 lb

## 2011-11-22 DIAGNOSIS — I70219 Atherosclerosis of native arteries of extremities with intermittent claudication, unspecified extremity: Secondary | ICD-10-CM

## 2011-11-22 MED ORDER — CLOPIDOGREL BISULFATE 75 MG PO TABS: 75.0000 mg | ORAL_TABLET | Freq: Every day | ORAL | Status: AC

## 2011-11-22 NOTE — Progress Notes (Signed)
[facility] [place]  Postoperative Visit  History of Present Illness  [name] is a 66 y.o. male who presents for postoperative follow-up for procedure 10/17/11:  1. Left common femoral artery cannulation under ultrasound guidance  2. Aortogram  3. R leg runoff  4. Right fem-pop graft cutting angioplasty  The patient notes no lower extremity symptoms.  The patient is able to complete their activities of daily living.  The patient's current symptoms are: none.  Pt denies any complication for L femoral artery cannulation.  Past Medical History, Past Surgical History, Social History, Family History, Medications, Allergies, and Review of Systems are unchanged from previous visit on 10/08/11. On ROS, no claudication, no rest pain, no ulcers.  Physical Examination  Filed Vitals:   11/22/11 1146  BP: 128/79  Pulse: 87  Resp: 20  Height: 5\' 11"  (1.803 m)  Weight: 174 lb (78.926 kg)    General: A&O x 3, WDWN  Pulmonary: Sym exp, good air movt, CTAB, no rales, rhonchi, & wheezing  Cardiac: RRR, Nl S1, S2, no Murmurs, rubs or gallops  Vascular: Vessel Right Left  Radial Palpable Palpable  Ulnary Palpable Palpable  Brachial Palpable Palpable  Carotid Palpable, without bruit Palpable, without bruit  Aorta Non-palpable N/A  Femoral Palpable Palpable  Popliteal Non-palpable Non-palpable  PT Palpable Palpable  DP Palpable Palpable   Gastrointestinal: soft, NTND, -G/R, - HSM, - masses, - CVAT B  Musculoskeletal: M/S 5/5 throughout , Extremities without ischemic changes , left groin without hematoma, no echymosis present at cannulation site  Neurologic: CN 2-12 intact , Pain and light touch intact in extremities , Motor exam as listed above  Medical Decision Making  [name] is a 66 y.o. male who presents s/p cutting PTA of L fem-pop bypass. Based on his angiographic findings, this patient needs: continue surveillance. BLE ABI and R fem-pop  graft duplex scheduled for 3 months.  He will follow up with Dr. Hart Rochester at that time I discussed in depth with the patient the nature of atherosclerosis, and emphasized the importance of maximal medical management including strict control of blood pressure, blood glucose, and lipid levels, obtaining regular exercise, and cessation of smoking.  The patient is aware that without maximal medical management the underlying atherosclerotic disease process will progress, limiting the benefit of any interventions.  Thank you for allowing [place] to participate in this patient's care.  Leonides Sake, MD [facility] [place] Office: [phone] Pager: [phone]

## 2011-12-16 ENCOUNTER — Ambulatory Visit (INDEPENDENT_AMBULATORY_CARE_PROVIDER_SITE_OTHER): Payer: Medicare PPO | Admitting: Cardiovascular Disease

## 2011-12-16 ENCOUNTER — Encounter: Payer: Self-pay | Admitting: Cardiovascular Disease

## 2011-12-16 DIAGNOSIS — I10 Essential (primary) hypertension: Secondary | ICD-10-CM

## 2011-12-16 DIAGNOSIS — I509 Heart failure, unspecified: Secondary | ICD-10-CM

## 2011-12-16 DIAGNOSIS — I739 Peripheral vascular disease, unspecified: Secondary | ICD-10-CM

## 2011-12-16 MED ORDER — CARVEDILOL 3.125 MG PO TABS: 3.1250 mg | ORAL_TABLET | Freq: Two times a day (BID) | ORAL | Status: DC

## 2011-12-16 MED ORDER — LOSARTAN POTASSIUM-HCTZ 100-12.5 MG PO TABS: 1.0000 | ORAL_TABLET | Freq: Every day | ORAL | Status: DC

## 2011-12-16 NOTE — Assessment & Plan Note (Signed)
Right fempop with recent PCI 11/29  F/U [facility].  No claudication and good pulses

## 2011-12-16 NOTE — Assessment & Plan Note (Signed)
Well controlled.  Continue current medications and low sodium Dash type diet.    

## 2011-12-16 NOTE — Patient Instructions (Signed)
Your physician wants you to follow-up in: 6 months  With  DR NISHAN  You will receive a reminder letter in the mail two months in advance. If you don't receive a letter, please call our office to schedule the follow-up appointment.  Your physician recommends that you continue on your current medications as directed. Please refer to the Current Medication list given to you today.   

## 2011-12-16 NOTE — Assessment & Plan Note (Signed)
Discussed low carb diet.  Target hemoglobin A1c is 6.5 or less.  Continue current medications.  

## 2011-12-16 NOTE — Progress Notes (Signed)
Patient ID: [name], male   DOB: Apr 22, 1946, 66 y.o.   MRN: [id number] 66 yo referred by NP Weeks at [facility]. Hospitalized for gi bleed last month with Hb5.2. In hospital had echo with EF 45-50%. No clinical CHF. Started on ACE and diuretic. More coughing since on ACE. No history of CAD. Normal myovue 11/11. Normal ECG. Has vascular disease wit previous right fem-pop by Betti Cruz  Recent percutaneous intervention to right fempop graft 10/17/11 by Dr Imogene Burn   No clincial CHF. Volume normal and no SSCP, PND, orthopnea, edema. CRF; DM under good control. Compliant with meds. Reviewed labs from [facility] and Hct over 35 now.    Reviewed cardiac MRI from 10/9 and EF was 35% with no scar tissue  ROS: Denies fever, malais, weight loss, blurry vision, decreased visual acuity, cough, sputum, SOB, hemoptysis, pleuritic pain, palpitaitons, heartburn, abdominal pain, melena, lower extremity edema, claudication, or rash.  All other systems reviewed and negative  General: Affect appropriate Healthy:  appears stated age HEENT: normal Neck supple with no adenopathy JVP normal no bruits no thyromegaly Lungs clear with no wheezing and good diaphragmatic motion Heart:  S1/S2 no murmur, no rub, gallop or click PMI normal Abdomen: benighn, BS positve, no tenderness, no AAA no bruit.  No HSM or HJR Distal pulses intact with no bruits Prev right fempop No edema Neuro non-focal Skin warm and dry No muscular weakness   Current Outpatient Prescriptions  Medication Sig Dispense Refill  . aspirin EC 81 MG tablet Take 1 tablet (81 mg total) by mouth daily.  150 tablet  2  . carvedilol (COREG) 3.125 MG tablet Take 1 tablet (3.125 mg total) by mouth 2 (two) times daily.  60 tablet  11  . clarithromycin (BIAXIN) 500 MG tablet       . clopidogrel (PLAVIX) 75 MG tablet Take 1 tablet (75 mg total) by mouth daily.  30 tablet  0  . clopidogrel (PLAVIX) 75 MG tablet Take 1 tablet (75 mg total) by mouth daily.  90 tablet  3    . Ferrous Sulfate (IRON) 325 (65 FE) MG TABS Take 1 tablet by mouth daily.        . fluticasone (FLONASE) 50 MCG/ACT nasal spray Place 2 sprays into the nose 2 (two) times daily.       . furosemide (LASIX) 40 MG tablet       . HYDROcodone-acetaminophen (NORCO) 10-325 MG per tablet Take 1 tablet by mouth every 6 (six) hours as needed. For pain      . insulin aspart (NOVOLOG) 100 UNIT/ML injection Inject 0-18 Units into the skin 3 (three) times daily before meals. Per patient's sliding scale      . insulin glargine (LANTUS) 100 UNIT/ML injection Inject 10-16 Units into the skin 2 (two) times daily. 16 in the morning, 10 in the pm      . losartan (COZAAR) 50 MG tablet       . losartan-hydrochlorothiazide (HYZAAR) 100-12.5 MG per tablet Take 1 tablet by mouth daily.        . metoprolol (TOPROL-XL) 50 MG 24 hr tablet       . omeprazole (PRILOSEC) 40 MG capsule Take 40 mg by mouth 2 (two) times daily.       . ONE TOUCH ULTRA TEST test strip       . RELION INSULIN SYR 0.5ML/31G 31G X 5/16" 0.5 ML MISC       . vitamin C (ASCORBIC ACID) 500 MG tablet  Take 500 mg by mouth daily.        . Vitamin D, Ergocalciferol, (DRISDOL) 50000 UNITS CAPS Take 50,000 Units by mouth every 7 (seven) days. On tuesdays         Allergies  Review of patient's allergies indicates no known allergies.  Electrocardiogram:  Assessment and Plan

## 2011-12-16 NOTE — Assessment & Plan Note (Signed)
Euvolemic  ON ARB with no cough.  F/U EF 1 year

## 2012-01-07 ENCOUNTER — Encounter (HOSPITAL_COMMUNITY): Payer: Self-pay | Admitting: Oncology

## 2012-01-07 ENCOUNTER — Encounter (HOSPITAL_COMMUNITY): Payer: Medicare HMO | Attending: Oncology | Admitting: Oncology

## 2012-01-07 VITALS — BP 143/60 | HR 75 | Temp 97.6°F | Ht 70.5 in | Wt 178.0 lb

## 2012-01-07 DIAGNOSIS — Z87891 Personal history of nicotine dependence: Secondary | ICD-10-CM

## 2012-01-07 DIAGNOSIS — Z794 Long term (current) use of insulin: Secondary | ICD-10-CM | POA: Insufficient documentation

## 2012-01-07 DIAGNOSIS — I10 Essential (primary) hypertension: Secondary | ICD-10-CM | POA: Insufficient documentation

## 2012-01-07 DIAGNOSIS — I509 Heart failure, unspecified: Secondary | ICD-10-CM | POA: Insufficient documentation

## 2012-01-07 DIAGNOSIS — D509 Iron deficiency anemia, unspecified: Secondary | ICD-10-CM | POA: Insufficient documentation

## 2012-01-07 DIAGNOSIS — K449 Diaphragmatic hernia without obstruction or gangrene: Secondary | ICD-10-CM | POA: Insufficient documentation

## 2012-01-07 DIAGNOSIS — Z9119 Patient's noncompliance with other medical treatment and regimen: Secondary | ICD-10-CM | POA: Insufficient documentation

## 2012-01-07 DIAGNOSIS — Z79899 Other long term (current) drug therapy: Secondary | ICD-10-CM | POA: Insufficient documentation

## 2012-01-07 DIAGNOSIS — D649 Anemia, unspecified: Secondary | ICD-10-CM | POA: Insufficient documentation

## 2012-01-07 DIAGNOSIS — R0602 Shortness of breath: Secondary | ICD-10-CM | POA: Insufficient documentation

## 2012-01-07 DIAGNOSIS — E119 Type 2 diabetes mellitus without complications: Secondary | ICD-10-CM | POA: Insufficient documentation

## 2012-01-07 DIAGNOSIS — I5020 Unspecified systolic (congestive) heart failure: Secondary | ICD-10-CM | POA: Insufficient documentation

## 2012-01-07 DIAGNOSIS — Z7902 Long term (current) use of antithrombotics/antiplatelets: Secondary | ICD-10-CM | POA: Insufficient documentation

## 2012-01-07 DIAGNOSIS — Z91199 Patient's noncompliance with other medical treatment and regimen due to unspecified reason: Secondary | ICD-10-CM | POA: Insufficient documentation

## 2012-01-07 DIAGNOSIS — K219 Gastro-esophageal reflux disease without esophagitis: Secondary | ICD-10-CM | POA: Insufficient documentation

## 2012-01-07 HISTORY — DX: Personal history of nicotine dependence: Z87.891

## 2012-01-07 HISTORY — DX: Anemia, unspecified: D64.9

## 2012-01-07 LAB — DIFFERENTIAL
Basophils Absolute: 0 10*3/uL (ref 0.0–0.1)
Basophils Relative: 1 % (ref 0–1)
Eosinophils Absolute: 0.2 10*3/uL (ref 0.0–0.7)
Lymphocytes Relative: 38 % (ref 12–46)
Lymphs Abs: 1.6 10*3/uL (ref 0.7–4.0)
Monocytes Absolute: 0.3 10*3/uL (ref 0.1–1.0)
Neutro Abs: 2.2 10*3/uL (ref 1.7–7.7)

## 2012-01-07 LAB — COMPREHENSIVE METABOLIC PANEL
AST: 35 U/L (ref 0–37)
BUN: 13 mg/dL (ref 6–23)
CO2: 25 mEq/L (ref 19–32)
Calcium: 9.4 mg/dL (ref 8.4–10.5)
Chloride: 106 mEq/L (ref 96–112)
Creatinine, Ser: 0.84 mg/dL (ref 0.50–1.35)
GFR calc Af Amer: >90 mL/min (ref >90)
GFR calc non Af Amer: 89 mL/min — ABNORMAL LOW (ref >90)
Glucose, Bld: 182 mg/dL — ABNORMAL HIGH (ref 70–99)
Total Bilirubin: 0.2 mg/dL — ABNORMAL LOW (ref 0.3–1.2)

## 2012-01-07 LAB — CBC
HCT: 28.6 % — ABNORMAL LOW (ref 39.0–52.0)
Hemoglobin: 8.7 g/dL — ABNORMAL LOW (ref 13.0–17.0)
MCH: 24.9 pg — ABNORMAL LOW (ref 26.0–34.0)
MCHC: 30.4 g/dL (ref 30.0–36.0)
MCV: 81.7 fL (ref 78.0–100.0)
RDW: 16.6 % — ABNORMAL HIGH (ref 11.5–15.5)

## 2012-01-07 LAB — RETICULOCYTES: RBC.: 3.5 MIL/uL — ABNORMAL LOW (ref 4.22–5.81)

## 2012-01-07 IMAGING — CR DG CHEST 2V
2 series · 2 of 2 positions shown · non-contrast
Comparison: 03/01/2011

CLINICAL DATA: Cough.

CHEST - 2 VIEW

[w chest pa]
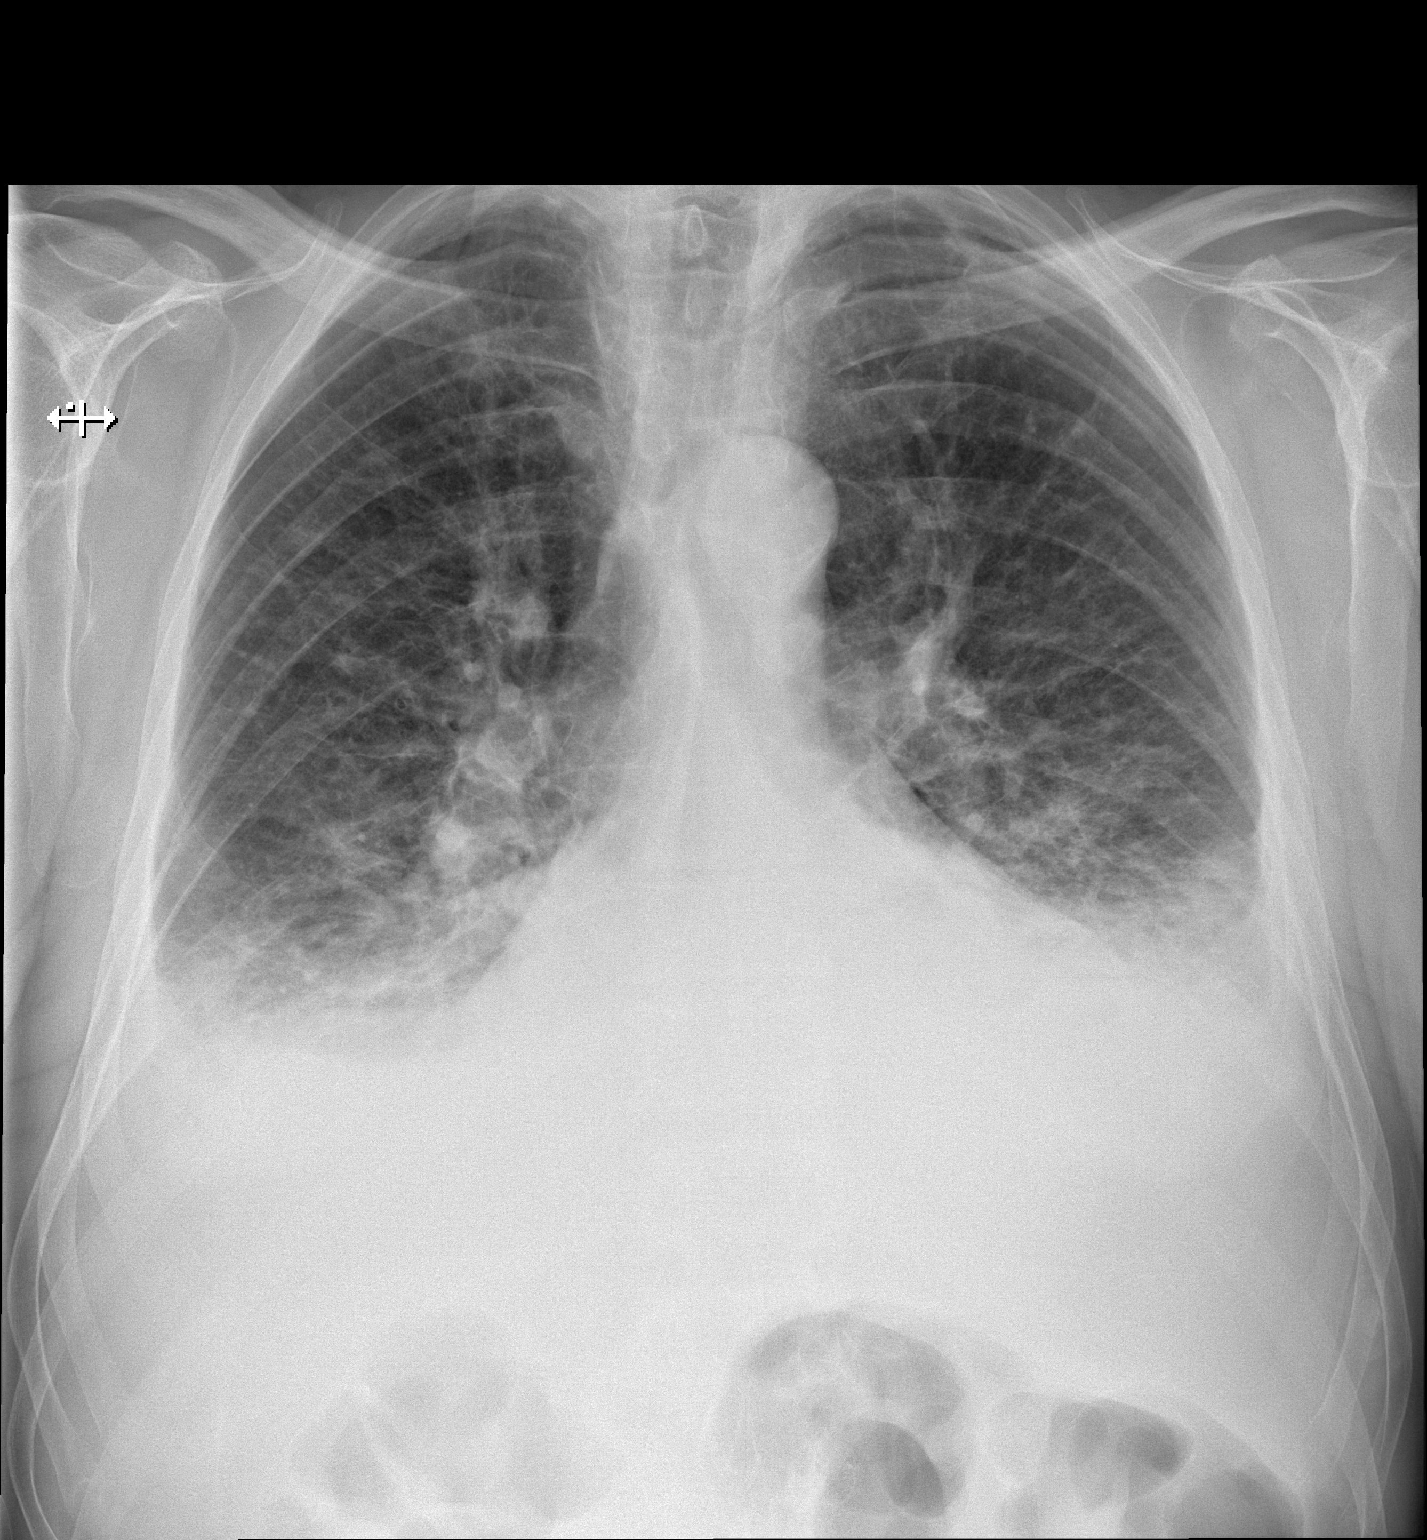

[w chest lat]
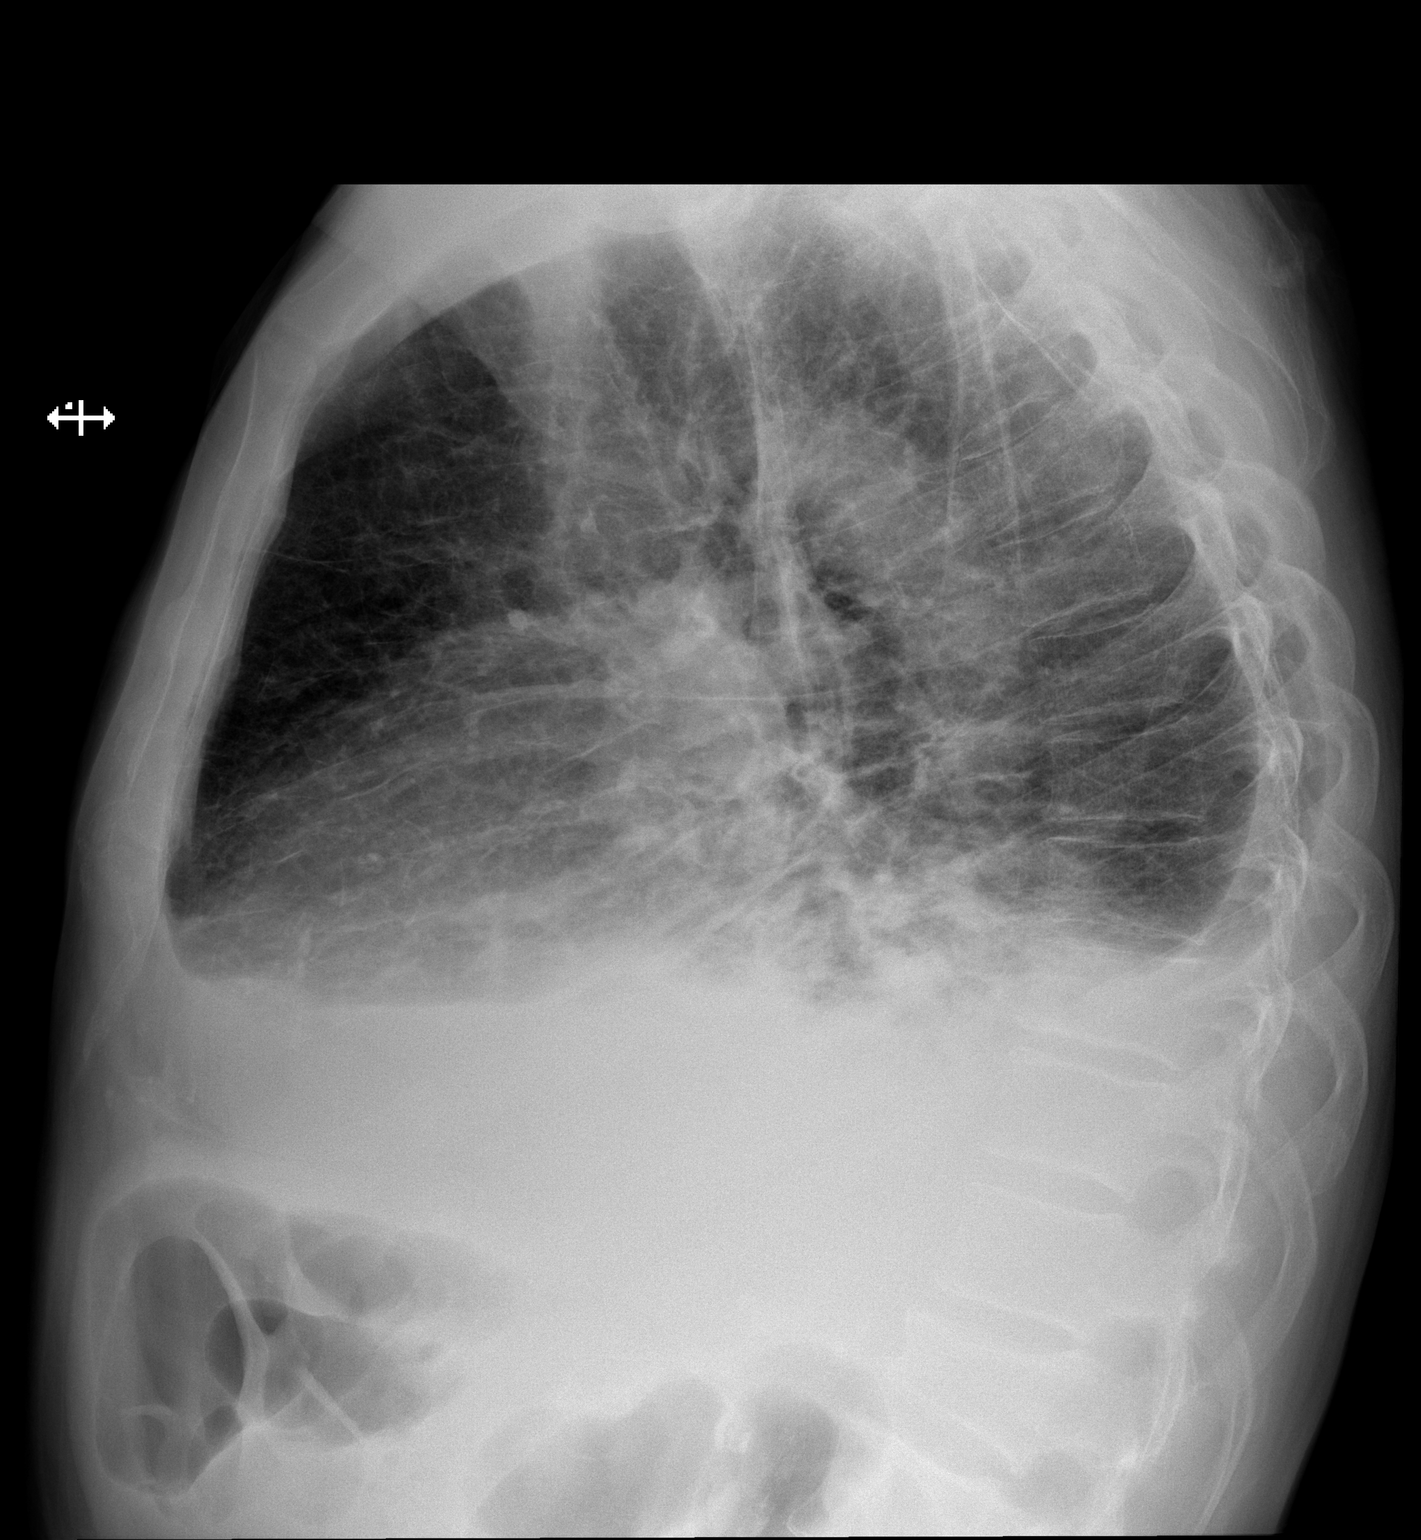

[2 of 2 positions shown; findings below may reference images not displayed]

FINDINGS: Diffuse pulmonary interstitial edema and small bilateral
pleural effusions are seen which are new since previous study.
Increased cardiac enlargement is also seen.  Associated bibasilar
atelectasis also noted. Underlying COPD again noted.
IMPRESSION: 1.  Mild to moderate acute congestive heart failure, with small
bilateral pleural effusions and bibasilar atelectasis.
2.  COPD.

## 2012-01-07 NOTE — Patient Instructions (Signed)
[name]  [id number] [birth year]   [facility]  Discharge Instructions  RECOMMENDATIONS MADE BY THE CONSULTANT AND ANY TEST RESULTS WILL BE SENT TO YOUR REFERRING DOCTOR.   EXAM FINDINGS BY MD TODAY AND SIGNS AND SYMPTOMS TO REPORT TO CLINIC OR PRIMARY MD: we will check some labs today and get a scan of your chest.    MEDICATIONS PRESCRIBED: Increase iron to 1 pill twice daily      SPECIAL INSTRUCTIONS/FOLLOW-UP: Lab work Needed in 1 month, Xray Studies Needed CT of chest just before appointment with PA and Return to Clinic in 1 month to see PA.   I acknowledge that I have been informed and understand all the instructions given to me and received a copy. I do not have any more questions at this time, but understand that I may call the Specialty Clinic at [facility] at [phone] during business hours should I have any further questions or need assistance in obtaining follow-up care.    __________________________________________  _____________  __________ Signature of Patient or Authorized Representative            Date                   Time    __________________________________________ Nurse's Signature

## 2012-01-07 NOTE — Progress Notes (Signed)
[name] presented for labwork. Labs per MD order drawn via Peripheral Line 23 gauge needle inserted in left AC   Good blood return present. Procedure without incident.  Needle removed intact. Patient tolerated procedure well.

## 2012-01-07 NOTE — Progress Notes (Signed)
[facility] NEW PATIENT EVALUATION   Name: [name] Date: 01/07/2012 MRN: [id number] DOB: 04/08/1946    CC: [name], MD, MD     DIAGNOSIS: Anemia   HISTORY OF PRESENT ILLNESS:[name] is a 66 y.o. male who has a past medical history significant for history of GI bleeding, mild systolic congestive heart failure, insulin dependent diabetes, GERD, history of H. Pylori positive antibody S/P treatment, HTN, history of tobacco abuse, and peripheral vascular disease who was referred to the [facility] for anemia.    Review of the patient's chart reveals that the patient was admitted to the [facility] in September of 2012 with a hemoglobin of of nearly 5.6 g/dL. The patient was admitted on 08/15/2011 and discharged on 08/21/2011. He presented with a low hemoglobin and it was suspected that it was from a gastrointestinal bleed. He is status post 4 units of packed red blood cells in the hospital. He also underwent 3 colonoscopies, EGD, and camera capsule study. All of which did not reveal any bleeding. However he was diagnosed with a positive H. pylori antibody and was treated with clarithromycin and amoxicillin for 2 weeks.  The patient admits that prior to this admission he's never been to hold he had an anemia. Interestingly, the patient does admit to taking iron supplementation by mouth approximately 2 years ago. He is unable to tell me exactly why he did that or if he had a previous diagnosis of iron deficiency anemia.  The patient does admit to shortness of breath on exertion. He also notes constipation. He reports that his usual bowel movement schedule is a bowel movement every month. Interestingly, he notes that in the past few days he's gone to the bathroom daily. He does admit to some black tarry stool.   The patient denies any headaches, dizziness, double vision, fevers, chills, night sweats, abdominal pain, chest pain, heart palpitations,  diarrhea, blood in stool, urinary pain, urinary burning, urinary frequency, hematuria, spontaneous bleeding, easy bruisability, gingival bleeding, pica, ice cravings, and dirt cravings, starch cravings.   FAMILY HISTORY: family history includes Cancer in his mother.  The patient's mother passed away at the age of 41 due to "brain cancer."  His father is living at the age of 59. He is presently in a nursing home. He has a medical history significant for tuberculosis as a child, aortic aneurysm, COPD, BPH, hypertension, and dementia.  Patient has one deceased brother at the age of 76 due to a 4 wheeler accident. He has 3 brothers who are living. One is 61 and suffers from Parkinson's disease. He has twin brothers at the age of 31 who have diabetes and the oldest point brother has Parkinson's disease. The patient has 2 daughters age 30 and 45 with no medical problems that he is aware of.   PAST MEDICAL HISTORY:  has a past medical history of Diabetes mellitus (age 64); Arthritis; Reflux; Constipation (02/12/11); Leg pain (02/12/11); Joint pain (02/12/11); Hypertension; Anemia; and Peripheral vascular disease.  1. History of GI bleeding 2. Mild systolic heart failure with ejection fraction of 45-50% with diffuse hypokinesis 3. Insulin-dependent diabetes 4. History of hiatal hernia 5. GERD 6. History of positive H. pylori antibody treated with clarithromycin amoxicillin for 2 weeks. 7. Hypertension 8. History of tobacco abuse, smoking up to 5 packs per day with an average of 3.5 packs per day for at least 35 years (at least a 75-pack-year history). 9. Peripheral vascular disease status  post left common from oral artery cannulation and right for moral popliteal graft cutting angioplasty.     CURRENT MEDICATIONS:  Acetaminophen (Tab) TYLENOL 500 MG Take 500 mg by mouth every 6 (six) hours as needed. Ascorbic Acid (Tab) ASCORBIC ACID 500 MG Take 500 mg by mouth daily. Aspirin (Tablet Delayed Response)  aspirin EC 81 MG Take 1 tablet (81 mg total) by mouth daily. Carvedilol (Tab) COREG 3.125 MG Take 1 tablet (3.125 mg total) by mouth 2 (two) times daily. Clarithromycin (Tab) BIAXIN 500 MG Clopidogrel Bisulfate (Tab) PLAVIX 75 MG Take 1 tablet (75 mg total) by mouth daily. Ergocalciferol (Cap) DRISDOL 16109 UNITS Take 50,000 Units by mouth every 7 (seven) days. On tuesdays Ferrous Sulfate (Tab) Iron 325 (65 FE) MG Take 1 tablet by mouth daily. Fluticasone Propionate (Suspension) FLONASE 50 MCG/ACT Place 2 sprays into the nose 2 (two) times daily. Furosemide (Tab) LASIX 40 MG Take 40 mg by mouth daily. Glucose Blood (Strip) ONE TOUCH ULTRA TEST Hydrocodone-Acetaminophen (Tab) NORCO 10-325 MG Take 1 tablet by mouth every 6 (six) hours as needed. For pain Insulin Aspart (Solution) novoLOG 100 UNIT/ML Inject 0-18 Units into the skin 3 (three) times daily before meals. Per patient's sliding scale Insulin Glargine (Solution) LANTUS 100 UNIT/ML Inject 10-16 Units into the skin 2 (two) times daily. 16 in the morning, 12 in the pm Insulin Syringe-Needle U-100 (Misc) RELION INSULIN SYR 0.5ML/31G 31G X 5/16" 0.5 ML Losartan Potassium (Tab) COZAAR 100 MG Take 100 mg by mouth daily. Losartan Potassium-HCTZ (Tab) HYZAAR 100-12.5 MG Take 1 tablet by mouth daily. Metoprolol Succinate (Tablet SR 24 hr) TOPROL-XL 50 MG Omeprazole (Capsule Delayed Release) PRILOSEC 40 MG Take 40 mg by mouth 2 (two) times daily.   Patient reports that he was placed on aspirin by his cardiologist. He was placed on Plavix for his vascular surgeon do to his peripheral vascular disease. He was placed on iron by mouth last Tuesday (approximately 7 days ago) by his primary care physician.    SOCIAL HISTORY:  reports that he quit smoking about 1 years ago. His smoking use included Cigarettes. He has a 120 pack-year smoking history. He has never used smokeless tobacco. He reports that he drinks about 7.2 ounces of alcohol per week. He reports that he  does not use illicit drugs.  Patient was born in [place] and presently [place]. He completed 10th grade of high school. 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 He is widowed and his wife passed away do to a suicide and overdose.  He lives by himself at home. He does admit to alcohol abuse drinking approximately a 12 pack of alcohol per week and one bottle of wine in approximately 2 months time. He does admit to tobacco abuse up to 5 packs per day (with an average of 3.5 packs per day) for at least 35 years resulting in a 75-pack-year smoking history.    ALLERGIES: Review of patient's allergies indicates no known allergies.   LABORATORY DATA:  Laboratory work dated 12/30/2011 at [facility] reveals a white blood cell count of 4.7, hemoglobin 8.3, MCV 79.5, platelet count 347. Lab work on 09/27/2011 reveals a white blood cell count 6.8, hemoglobin 12.1, MCV 84.6, platelet count 277. Lab work on 09/09/2011 reveals a white blood cell count of 5.4, hemoglobin 11.2, MCV 78.2, platelet count 319.  Blood work on 08/26/2011 reveal a blood cell count of  5.7, hemoglobin 10.7, MCV 74.9, platelet count 328. Blood work on 08/15/2011 reveals a white blood cell count of 6.4, hemoglobin 5.9, MCV 65.3, platelet count 431.     REVIEW OF SYSTEMS: Patient reports no health concerns.   PHYSICAL EXAM:  height is 5' 10.5" (1.791 m) and weight is 178 lb (80.74 kg). His oral temperature is 97.6 F (36.4 C). His blood pressure is 143/60 and his pulse is 75.  General appearance: alert, cooperative and no distress Head: Normocephalic, without obvious abnormality, atraumatic Neck: no adenopathy, no carotid bruit, supple, symmetrical, trachea midline and thyroid not enlarged, symmetric, no tenderness/mass/nodules Lymph nodes: Cervical, supraclavicular, and axillary nodes normal. Resp: diminished breath sounds throughout Back: symmetric, no  curvature. ROM normal. No CVA tenderness. Cardio: regular rate and rhythm, S1, S2 normal, no murmur, click, rub or gallop GI: soft, non-tender; bowel sounds normal; no masses,  no organomegaly and Liver edge palpated approximately 2 cm below the costophrenic margin at mid clavicular line with deep inspiration Extremities: extremities normal, atraumatic, no cyanosis or edema Neurologic: Alert and oriented X 3, normal strength and tone. Normal symmetric reflexes. Normal coordination and gait     IMPRESSION:  1. Anemia, likely iron deficiency anemia.  No lab work revealing an iron or ferritin level.  Will check today. 2. Possible history of GI bleeding without a clear identification of the location of the bleed with EGD, three colonoscopies, and camera capsule study.  All of which were negative.  3. Mild systolic heart failure with ejection fraction of 45-50% with diffuse hypokinesis 4. Insulin-dependent diabetes 5. History of hiatal hernia 6. GERD 7. History of positive H. pylori antibody treated with clarithromycin amoxicillin for 2 weeks. 8. Hypertension 9. History of tobacco abuse, smoking up to 5 packs per day with an average of 3.5 packs per day for at least 35 years (at least a 75-pack-year history). 10. Peripheral vascular disease status post left common from oral artery cannulation and right for moral popliteal graft cutting angioplasty.    PLAN:  1. Lab work today: CBC diff, CMET, Anemia panel, Reticulocyte count. 2. Low-dose spiral CT of chest without contrast to evaluate for occult malignancy due to high risk for bronchogenic carcinoma in light of a 75 + pack year tobacco smoking history. 3. Ferrous Sulfate 325 mg BID x 4 weeks. 4. Patient education regarding the pathophysiology and etiology of iron deficiency anemia.  He understands that we may need to further investigate to discover the etiology of his anemia.  5. Will contact [facility] to acquire records on the patient from  his hospital admission in September 2012.  This we before 's EMR conversion. 6. Lab work in 4 weeks: CBC diff, Ferritin, Iron/TIBC 7. 4 week lab work may be altered pending results from lab work ascertained today. 8. CT scan of chest will be coordinated to be done on the same day as lab work or follow-up appointment.  9. Return in 4 weeks for follow-up.   All questions were answered. The patient knows to call the clinic with any problems questions or concerns. Patient and plan discussed with Dr. Mariel Sleet and he is in agreement with the aforementioned. Patient seen and examined by Dr. Glenford Peers.   KEFALAS,THOMAS

## 2012-01-08 ENCOUNTER — Other Ambulatory Visit (HOSPITAL_COMMUNITY): Payer: Self-pay | Admitting: Oncology

## 2012-01-08 DIAGNOSIS — D649 Anemia, unspecified: Secondary | ICD-10-CM

## 2012-01-08 LAB — IRON AND TIBC
Iron: 31 ug/dL — ABNORMAL LOW (ref 42–135)
UIBC: 488 ug/dL — ABNORMAL HIGH (ref 125–400)

## 2012-01-08 LAB — FOLATE: Folate: 11.4 ng/mL

## 2012-01-13 ENCOUNTER — Encounter (HOSPITAL_BASED_OUTPATIENT_CLINIC_OR_DEPARTMENT_OTHER): Payer: Medicare HMO

## 2012-01-13 DIAGNOSIS — D649 Anemia, unspecified: Secondary | ICD-10-CM

## 2012-01-13 NOTE — Progress Notes (Signed)
Labs drawn today for Methymalonic acid,Intrinsic factor

## 2012-01-15 LAB — INTRINSIC FACTOR ANTIBODIES: Intrinsic Factor: NEGATIVE

## 2012-02-04 ENCOUNTER — Encounter (HOSPITAL_COMMUNITY): Payer: Medicare HMO

## 2012-02-05 ENCOUNTER — Telehealth: Payer: Self-pay | Admitting: Cardiovascular Disease

## 2012-02-05 ENCOUNTER — Ambulatory Visit (HOSPITAL_COMMUNITY): Payer: Medicare HMO

## 2012-02-05 ENCOUNTER — Ambulatory Visit (HOSPITAL_COMMUNITY): Payer: Medicare PPO | Admitting: Oncology

## 2012-02-05 NOTE — Telephone Encounter (Signed)
AFTER LONG DISCUSSION PT WAS NOT TAKING LOSARTAN /HCTZ 100/12.5 MG  WAS ONLY TAKING LOSARTAN 100MG   NEW SCRIPT FROM DR [facility] IN February WAS FILLED.  PT WILL RESTART LOSARTAN-HCTZ 100-12.5 MG AND CONT WITH FUROSEMIDE 40 MG  IN ADDITION  REMAINING MEDS ON LIST  WILL CALL NEXT WEEK WITH  UPDATE WILL ALSO  MONITOR B/P AND  WEIGHT.WILL FORWARD TO DR Eden Emms FOR REVIEW./CY

## 2012-02-05 NOTE — Telephone Encounter (Signed)
Pt had weight gain of 10-18lbs in the last 3 months, pls advise pt on fluid pill, swelling upper stomach

## 2012-02-10 ENCOUNTER — Ambulatory Visit (HOSPITAL_COMMUNITY): Payer: Medicare PPO | Admitting: Oncology

## 2012-02-17 ENCOUNTER — Encounter: Payer: Self-pay | Admitting: Vascular Surgery

## 2012-02-18 ENCOUNTER — Ambulatory Visit (INDEPENDENT_AMBULATORY_CARE_PROVIDER_SITE_OTHER): Payer: Medicare HMO | Admitting: Vascular Surgery

## 2012-02-18 ENCOUNTER — Encounter: Payer: Self-pay | Admitting: Vascular Surgery

## 2012-02-18 ENCOUNTER — Encounter (INDEPENDENT_AMBULATORY_CARE_PROVIDER_SITE_OTHER): Payer: Medicare HMO

## 2012-02-18 ENCOUNTER — Encounter (INDEPENDENT_AMBULATORY_CARE_PROVIDER_SITE_OTHER): Payer: Medicare HMO | Admitting: *Deleted

## 2012-02-18 VITALS — BP 140/86 | HR 98 | Temp 97.7°F | Resp 16 | Ht 71.0 in | Wt 177.0 lb

## 2012-02-18 DIAGNOSIS — Z48812 Encounter for surgical aftercare following surgery on the circulatory system: Secondary | ICD-10-CM

## 2012-02-18 DIAGNOSIS — I70219 Atherosclerosis of native arteries of extremities with intermittent claudication, unspecified extremity: Secondary | ICD-10-CM

## 2012-02-18 DIAGNOSIS — I739 Peripheral vascular disease, unspecified: Secondary | ICD-10-CM

## 2012-02-18 NOTE — Progress Notes (Signed)
Subjective:     Patient ID: [name], male   DOB: Jul 05, 1946, 66 y.o.   MRN: [id number]  HPI this 66 year old male patient returns for followup regarding his recent intervention performed by Dr. Standley Brooking 3 months ago on a mid thigh the vein graft stenosis involving a right femoral popliteal saphenous vein graft I inserted in April of 2012. The patient has had no claudication symptoms in either leg since the procedure. He denies any history of infection, gangrene, cellulitis, rest pain, or nonhealing ulcers.  Past Medical History  Diagnosis Date  . Diabetes mellitus age 25    insulin dependent  . Arthritis   . Reflux   . Constipation 02/12/11  . Leg pain 02/12/11    with walking  . Joint pain 02/12/11    right shoulder  . Hypertension   . Anemia   . Peripheral vascular disease   . Anemia 01/07/2012  . History of tobacco abuse 01/07/2012    History  Substance Use Topics  . Smoking status: Former Smoker -- 3.0 packs/day for 40 years    Types: Cigarettes    Quit date: 01/16/2010  . Smokeless tobacco: Never Used  . Alcohol Use: 7.2 oz/week    12 Cans of beer per week    Family History  Problem Relation Age of Onset  . Cancer Mother     No Known Allergies  Current outpatient prescriptions:acetaminophen (TYLENOL) 500 MG tablet, Take 500 mg by mouth every 6 (six) hours as needed., Disp: , Rfl: ;  aspirin EC 81 MG tablet, Take 1 tablet (81 mg total) by mouth daily., Disp: 150 tablet, Rfl: 2;  carvedilol (COREG) 3.125 MG tablet, Take 1 tablet (3.125 mg total) by mouth 2 (two) times daily., Disp: 180 tablet, Rfl: 3;  clarithromycin (BIAXIN) 500 MG tablet, , Disp: , Rfl:  clopidogrel (PLAVIX) 75 MG tablet, Take 1 tablet (75 mg total) by mouth daily., Disp: 90 tablet, Rfl: 3;  Ferrous Sulfate (IRON) 325 (65 FE) MG TABS, Take 1 tablet by mouth daily.  , Disp: , Rfl: ;  fluticasone (FLONASE) 50 MCG/ACT nasal spray, Place 2 sprays into the nose 2 (two) times daily. , Disp: , Rfl: ;  furosemide  (LASIX) 40 MG tablet, Take 40 mg by mouth daily. , Disp: , Rfl:  HYDROcodone-acetaminophen (NORCO) 10-325 MG per tablet, Take 1 tablet by mouth every 6 (six) hours as needed. For pain, Disp: , Rfl: ;  insulin aspart (NOVOLOG) 100 UNIT/ML injection, Inject 0-18 Units into the skin 3 (three) times daily before meals. Per patient's sliding scale, Disp: , Rfl: ;  insulin glargine (LANTUS) 100 UNIT/ML injection, Inject 10-16 Units into the skin 2 (two) times daily. 16 in the morning, 12 in the pm, Disp: , Rfl:  losartan-hydrochlorothiazide (HYZAAR) 100-12.5 MG per tablet, Take 1 tablet by mouth daily., Disp: 90 tablet, Rfl: 3;  metoprolol (TOPROL-XL) 50 MG 24 hr tablet, , Disp: , Rfl: ;  omeprazole (PRILOSEC) 40 MG capsule, Take 40 mg by mouth 2 (two) times daily. , Disp: , Rfl: ;  ONE TOUCH ULTRA TEST test strip, , Disp: , Rfl: ;  RELION INSULIN SYR 0.5ML/31G 31G X 5/16" 0.5 ML MISC, , Disp: , Rfl:  vitamin C (ASCORBIC ACID) 500 MG tablet, Take 500 mg by mouth daily.  , Disp: , Rfl: ;  Vitamin D, Ergocalciferol, (DRISDOL) 50000 UNITS CAPS, Take 50,000 Units by mouth every 7 (seven) days. On tuesdays, Disp: , Rfl: ;  DISCONTD: losartan (COZAAR) 100 MG  tablet, Take 100 mg by mouth daily. , Disp: , Rfl:   BP 140/86  Pulse 98  Temp(Src) 97.7 F (36.5 C) (Oral)  Resp 16  Ht 5\' 11"  (1.803 m)  Wt 177 lb (80.287 kg)  BMI 24.69 kg/m2  Body mass index is 24.69 kg/(m^2).         Review of Systems denies chest pain, dyspnea on exertion, PND, orthopnea. Does have chronic anemia.     Objective:   Physical Exam blood pressure 140/86 heart rate 98 respirations 16 Gen. alert and oriented x3 no apparent distress HEENT normal for age Lungs no rhonchi or wheezing Cardiovascular regular rhythm no murmurs carotid pulses 3+ no bruits Abdomen soft nontender no palpable masses Lower extremities right leg with 3+ femoral popliteal 2+ posterior tibial pulse palpable left leg with 3+ femoral 2+ popliteal 2+  dorsalis pedis pulse palpable  Today I ordered a duplex scan of the right femoral popliteal vein graft and ABIs. ABI on the right is 1.29 on the left 0.87. There is no evidence of elevated velocities in the vein graft on duplex scanning in the bypass is widely patent    Assessment:     Good result following PTA with cutting balloon on right femoral popliteal vein graft stenosis performed by Dr. Standley Brooking November 2012    Plan:     Continued to follow on regular basis-return in 6 months with duplex scan of the graft and ABIs and see nurse practitioner

## 2012-02-18 NOTE — Progress Notes (Signed)
Addended by: Sharee Pimple on: 02/18/2012 10:38 AM   Modules accepted: Orders

## 2012-02-24 NOTE — Procedures (Unsigned)
BYPASS GRAFT EVALUATION  INDICATION:  Follow up right lower extremity graft.  HISTORY: Diabetes:  Yes. Cardiac:  No. Hypertension:  Yes. Smoking:  Previous. Previous Surgery:  Right common femoral-to-below-knee popliteal graft 03/06/2011.  PTA of right bypass graft performed by Dr. Imogene Burn 10/17/2011.  SINGLE LEVEL ARTERIAL EXAM                              RIGHT              LEFT Brachial:                    126                124 Anterior tibial:             140                99 Posterior tibial:            163                107 Peroneal: Ankle/brachial index:        1.29               0.85  PREVIOUS ABI:  Date: 10/08/2011  RIGHT:  0.90  LEFT:  0.94  LOWER EXTREMITY BYPASS GRAFT DUPLEX EXAM:  DUPLEX:  Patent right lower extremity femoral-to-popliteal bypass graft with no evidence of stenosis.  IMPRESSION: 1. The right ankle brachial index is within normal limits and has     shown improvement since the previous study performed 10/08/2011. 2. The left ankle brachial index is 0.85 which suggests the presence     of mild arterial occlusive disease.  There is essentially no change     since the previous ankle brachial index. 3. Patent right femoral-to-popliteal bypass graft with velocity     measurements shown on the following worksheet.      ___________________________________________ Quita Skye. [facility], M.D.  EM/MEDQ  D:  02/19/2012  T:  02/19/2012  Job:  [id number]

## 2012-03-17 ENCOUNTER — Telehealth: Payer: Self-pay | Admitting: Cardiovascular Disease

## 2012-03-17 NOTE — Telephone Encounter (Signed)
PER PT B/P AT OFFICE VISIT WAS 88/58 AND HAS GOTTEN SIMILAR READINGS  ON  HOME MONITOR PT C/O WEAKNESS AND DIZZINESS AS WELL   PMD   DECREASED HYZAAR 100/12.5 MG  1/2 TAB PT WANTED YOU TO KNOW  PT AWARE WILL FORWARD TO DR Eden Emms FOR  REVIEW .Zack Seal

## 2012-03-17 NOTE — Telephone Encounter (Signed)
New Problem:     Patient called in because he went to his PCP and they cut his losartan-hydrochlorothiazide (HYZAAR) 100-12.5 MG per tablet dose in half and wanted to know if that was ok. Please call back.

## 2012-03-17 NOTE — Telephone Encounter (Signed)
Make sure he is not on hyzaar and cozaar.  Just take cozaar 25mg  and no diuretic.  F/U Hct and BMET

## 2012-03-18 NOTE — Telephone Encounter (Signed)
PT AWARE TO ONLY TAKE LOSARTAN 25 MG  EVERY DAY  PER PT JUST HAD LABS DONE TODAY AT DR MOORE'S OFFICE  PT WILL HAVE THEM FAX COPY OF RESULTS HERE./CY

## 2012-04-17 ENCOUNTER — Ambulatory Visit (HOSPITAL_COMMUNITY): Payer: Medicare HMO | Admitting: Oncology

## 2012-06-16 ENCOUNTER — Ambulatory Visit (INDEPENDENT_AMBULATORY_CARE_PROVIDER_SITE_OTHER): Payer: Medicare HMO | Admitting: Cardiovascular Disease

## 2012-06-16 ENCOUNTER — Encounter: Payer: Self-pay | Admitting: Cardiovascular Disease

## 2012-06-16 VITALS — BP 142/70 | HR 68 | Ht 71.0 in | Wt 173.1 lb

## 2012-06-16 DIAGNOSIS — Z79899 Other long term (current) drug therapy: Secondary | ICD-10-CM

## 2012-06-16 DIAGNOSIS — I10 Essential (primary) hypertension: Secondary | ICD-10-CM

## 2012-06-16 DIAGNOSIS — I428 Other cardiomyopathies: Secondary | ICD-10-CM

## 2012-06-16 DIAGNOSIS — E119 Type 2 diabetes mellitus without complications: Secondary | ICD-10-CM

## 2012-06-16 DIAGNOSIS — I739 Peripheral vascular disease, unspecified: Secondary | ICD-10-CM

## 2012-06-16 DIAGNOSIS — I429 Cardiomyopathy, unspecified: Secondary | ICD-10-CM

## 2012-06-16 DIAGNOSIS — I509 Heart failure, unspecified: Secondary | ICD-10-CM

## 2012-06-16 LAB — BASIC METABOLIC PANEL
BUN: 19 mg/dL (ref 6–23)
Calcium: 9 mg/dL (ref 8.4–10.5)
Creatinine, Ser: 0.9 mg/dL (ref 0.4–1.5)
GFR: 90.63 mL/min (ref >60.00)

## 2012-06-16 NOTE — Patient Instructions (Addendum)
Your physician wants you to follow-up in: 6 MONTHS WITH DR Haywood Filler will receive a reminder letter in the mail two months in advance. If you don't receive a letter, please call our office to schedule the follow-up appointment. Your physician recommends that you continue on your current medications as directed. Please refer to the Current Medication list given to you today. Your physician has requested that you have a cardiac MRI. Cardiac MRI uses a computer to create images of your heart as its beating, producing both still and moving pictures of your heart and major blood vessels. For further information please visit [email or web address]. Please follow the instruction sheet given to you today for more information. DX CARDIOMYOPATHY Your physician recommends that you return for lab work in: TODAY  BMET  DX V58.69

## 2012-06-16 NOTE — Assessment & Plan Note (Signed)
Recent hypoglycemic episode.  Meds adjusted by [facility]  Target A1c closer to 7 given difficulty with low sugars

## 2012-06-16 NOTE — Progress Notes (Signed)
Patient ID: [name], male   DOB: 06/09/1946, 66 y.o.   MRN: [id number] 66 yo referred by NP Weeks at [facility]. Hospitalized for gi bleed last month with Hb5.2. In hospital had echo with EF 45-50%. No clinical CHF. Started on ACE and diuretic. More coughing since on ACE. No history of CAD. Normal myovue 11/11. Normal ECG. Has vascular disease wit previous right fem-pop by Betti Cruz Recent percutaneous intervention to right fempop graft 10/17/11 by Dr Imogene Burn No clincial CHF. Volume normal and no SSCP, PND, orthopnea, edema. CRF; DM under good control. Compliant with meds. Reviewed labs from [facility] and Hct over 35 now.  Reviewed cardiac MRI from 08/27/11  and EF was 35% with no scar tissue   ROS: Denies fever, malais, weight loss, blurry vision, decreased visual acuity, cough, sputum, SOB, hemoptysis, pleuritic pain, palpitaitons, heartburn, abdominal pain, melena, lower extremity edema, claudication, or rash.  All other systems reviewed and negative  General: Affect appropriate Healthy:  appears stated age HEENT: normal Neck supple with no adenopathy JVP normal no bruits no thyromegaly Lungs clear with no wheezing and good diaphragmatic motion Heart:  S1/S2 no murmur, no rub, gallop or click PMI normal Abdomen: benighn, BS positve, no tenderness, no AAA s/P Aobifem no bruit.  No HSM or HJR Distal pulses intact with no bruits No edema Neuro non-focal Skin warm and dry No muscular weakness   Current Outpatient Prescriptions  Medication Sig Dispense Refill  . acetaminophen (TYLENOL) 500 MG tablet Take 500 mg by mouth every 6 (six) hours as needed.      Marland Kitchen atorvastatin (LIPITOR) 40 MG tablet Take 40 mg by mouth daily.      . carvedilol (COREG) 3.125 MG tablet Take 1 tablet (3.125 mg total) by mouth 2 (two) times daily.  180 tablet  3  . clarithromycin (BIAXIN) 500 MG tablet       . clopidogrel (PLAVIX) 75 MG tablet Take 1 tablet (75 mg total) by mouth daily.  90 tablet  3  . Ferrous Sulfate  (IRON) 325 (65 FE) MG TABS Take 1 tablet by mouth daily.        . fluticasone (FLONASE) 50 MCG/ACT nasal spray Place 2 sprays into the nose 2 (two) times daily.       . furosemide (LASIX) 40 MG tablet Take 40 mg by mouth daily.       Marland Kitchen HYDROcodone-acetaminophen (NORCO) 10-325 MG per tablet Take 1 tablet by mouth every 6 (six) hours as needed. For pain      . insulin aspart (NOVOLOG) 100 UNIT/ML injection Inject 0-18 Units into the skin 3 (three) times daily before meals. Per patient's sliding scale      . insulin glargine (LANTUS) 100 UNIT/ML injection Inject 10-16 Units into the skin 2 (two) times daily. 16 in the morning, 12 in the pm      . losartan (COZAAR) 50 MG tablet Take 50 mg by mouth daily.      Marland Kitchen omeprazole (PRILOSEC) 40 MG capsule Take 40 mg by mouth 2 (two) times daily.       . ONE TOUCH ULTRA TEST test strip       . RELION INSULIN SYR 0.5ML/31G 31G X 5/16" 0.5 ML MISC       . vitamin C (ASCORBIC ACID) 500 MG tablet Take 500 mg by mouth daily.        Marland Kitchen aspirin EC 81 MG tablet Take 1 tablet (81 mg total) by mouth daily.  150 tablet  2    Allergies  Review of patient's allergies indicates no known allergies.  Electrocardiogram:  NSR rate 60 PR 210  Normal otherwise  Assessment and Plan

## 2012-06-16 NOTE — Assessment & Plan Note (Signed)
Euvolemic on better Rx  F/U MRI to reassess EF

## 2012-06-16 NOTE — Assessment & Plan Note (Signed)
Stable no claudication ABI's recently done at [facility] and normal

## 2012-06-16 NOTE — Assessment & Plan Note (Signed)
Well controlled.  Continue current medications and low sodium Dash type diet.    

## 2012-06-18 ENCOUNTER — Encounter: Payer: Self-pay | Admitting: Cardiovascular Disease

## 2012-06-24 ENCOUNTER — Ambulatory Visit (HOSPITAL_COMMUNITY)
Admission: RE | Admit: 2012-06-24 | Discharge: 2012-06-24 | Disposition: A | Discharge status: Home / Self Care | Payer: Medicare HMO | Source: Ambulatory Visit | Attending: Cardiovascular Disease | Admitting: Cardiovascular Disease

## 2012-06-24 DIAGNOSIS — I428 Other cardiomyopathies: Secondary | ICD-10-CM | POA: Insufficient documentation

## 2012-06-24 DIAGNOSIS — I429 Cardiomyopathy, unspecified: Secondary | ICD-10-CM

## 2012-06-24 MED ORDER — GADOBENATE DIMEGLUMINE 529 MG/ML IV SOLN
25.0000 mL | Freq: Once | INTRAVENOUS | Status: AC
  Administered 2012-06-24: 24 mL via INTRAVENOUS

## 2012-06-30 ENCOUNTER — Other Ambulatory Visit (HOSPITAL_COMMUNITY): Payer: Medicare HMO

## 2012-08-17 ENCOUNTER — Encounter: Payer: Self-pay | Admitting: Neurosurgery

## 2012-08-18 ENCOUNTER — Encounter (INDEPENDENT_AMBULATORY_CARE_PROVIDER_SITE_OTHER): Payer: Medicare HMO | Admitting: *Deleted

## 2012-08-18 ENCOUNTER — Ambulatory Visit (INDEPENDENT_AMBULATORY_CARE_PROVIDER_SITE_OTHER): Payer: Medicare HMO | Admitting: Neurosurgery

## 2012-08-18 DIAGNOSIS — Z48812 Encounter for surgical aftercare following surgery on the circulatory system: Secondary | ICD-10-CM

## 2012-08-18 DIAGNOSIS — I739 Peripheral vascular disease, unspecified: Secondary | ICD-10-CM

## 2013-01-19 ENCOUNTER — Ambulatory Visit: Payer: Medicare HMO | Admitting: Physical Therapy

## 2013-01-26 ENCOUNTER — Ambulatory Visit: Payer: Medicare HMO | Admitting: Physical Therapy

## 2013-02-09 ENCOUNTER — Telehealth: Payer: Self-pay | Admitting: Nurse Practitioner

## 2013-02-09 DIAGNOSIS — K219 Gastro-esophageal reflux disease without esophagitis: Secondary | ICD-10-CM

## 2013-02-09 MED ORDER — PANTOPRAZOLE SODIUM 40 MG PO TBEC: 40.0000 mg | DELAYED_RELEASE_TABLET | Freq: Every day | ORAL | Status: DC

## 2013-02-09 NOTE — Telephone Encounter (Signed)
Patient cant take Prilosec because it interfers with Plavix. Need to switch to protonix 40mg  daily. Rx sent to [facility]

## 2013-02-09 NOTE — Telephone Encounter (Signed)
RX sent to [facility]

## 2013-02-18 ENCOUNTER — Ambulatory Visit (INDEPENDENT_AMBULATORY_CARE_PROVIDER_SITE_OTHER): Payer: Medicare HMO | Admitting: Pharmacist

## 2013-02-18 VITALS — BP 142/72 | HR 74 | Ht 71.0 in | Wt 171.0 lb

## 2013-02-18 DIAGNOSIS — K219 Gastro-esophageal reflux disease without esophagitis: Secondary | ICD-10-CM

## 2013-02-18 DIAGNOSIS — E1065 Type 1 diabetes mellitus with hyperglycemia: Secondary | ICD-10-CM

## 2013-02-18 DIAGNOSIS — R7989 Other specified abnormal findings of blood chemistry: Secondary | ICD-10-CM | POA: Insufficient documentation

## 2013-02-18 DIAGNOSIS — N5201 Erectile dysfunction due to arterial insufficiency: Secondary | ICD-10-CM

## 2013-02-18 DIAGNOSIS — M25559 Pain in unspecified hip: Secondary | ICD-10-CM

## 2013-02-18 DIAGNOSIS — E785 Hyperlipidemia, unspecified: Secondary | ICD-10-CM

## 2013-02-18 DIAGNOSIS — E11319 Type 2 diabetes mellitus with unspecified diabetic retinopathy without macular edema: Secondary | ICD-10-CM | POA: Insufficient documentation

## 2013-02-18 DIAGNOSIS — E291 Testicular hypofunction: Secondary | ICD-10-CM

## 2013-02-18 DIAGNOSIS — M25551 Pain in right hip: Secondary | ICD-10-CM

## 2013-02-18 MED ORDER — OXYCODONE-ACETAMINOPHEN 10-325 MG PO TABS: 1.0000 | ORAL_TABLET | Freq: Four times a day (QID) | ORAL | Status: DC | PRN
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
## 2013-03-23 ENCOUNTER — Other Ambulatory Visit: Payer: Self-pay

## 2013-03-23 DIAGNOSIS — M25551 Pain in right hip: Secondary | ICD-10-CM

## 2013-03-23 MED ORDER — OXYCODONE-ACETAMINOPHEN 10-325 MG PO TABS: 1.0000 | ORAL_TABLET | Freq: Four times a day (QID) | ORAL | Status: DC | PRN

## 2013-03-23 NOTE — Telephone Encounter (Signed)
rx ready for pickup 

## 2013-03-23 NOTE — Telephone Encounter (Signed)
Last filled and seen 02/18/13   If approved print and have nurse notify patient

## 2013-03-23 NOTE — Telephone Encounter (Signed)
Patient notified rx up front and ready to pick up

## 2013-03-24 ENCOUNTER — Other Ambulatory Visit (HOSPITAL_COMMUNITY): Payer: Self-pay | Admitting: Family Medicine

## 2013-03-31 ENCOUNTER — Ambulatory Visit (INDEPENDENT_AMBULATORY_CARE_PROVIDER_SITE_OTHER): Payer: Medicare HMO | Admitting: Nurse Practitioner

## 2013-03-31 ENCOUNTER — Encounter: Payer: Self-pay | Admitting: Nurse Practitioner

## 2013-03-31 VITALS — BP 131/66 | HR 75 | Temp 96.8°F | Ht 71.25 in | Wt 162.0 lb

## 2013-03-31 DIAGNOSIS — E119 Type 2 diabetes mellitus without complications: Secondary | ICD-10-CM

## 2013-03-31 DIAGNOSIS — E291 Testicular hypofunction: Secondary | ICD-10-CM

## 2013-03-31 DIAGNOSIS — I10 Essential (primary) hypertension: Secondary | ICD-10-CM

## 2013-03-31 DIAGNOSIS — D649 Anemia, unspecified: Secondary | ICD-10-CM

## 2013-03-31 DIAGNOSIS — E785 Hyperlipidemia, unspecified: Secondary | ICD-10-CM

## 2013-03-31 LAB — THYROID PANEL WITH TSH
Free Thyroxine Index: 2.8 (ref 1.0–3.9)
T3 Uptake: 37.2 % — ABNORMAL HIGH (ref 22.5–37.0)
TSH: 2.225 u[IU]/mL (ref 0.350–4.500)

## 2013-03-31 LAB — COMPLETE METABOLIC PANEL WITH GFR
BUN: 17 mg/dL (ref 6–23)
CO2: 23 mEq/L (ref 19–32)
Calcium: 9.3 mg/dL (ref 8.4–10.5)
Chloride: 106 mEq/L (ref 96–112)
Creat: 1.04 mg/dL (ref 0.50–1.35)
GFR, Est African American: 85 mL/min
Glucose, Bld: 185 mg/dL — ABNORMAL HIGH (ref 70–99)

## 2013-03-31 LAB — POCT CBC
Hemoglobin: 10.9 g/dL — AB (ref 14.1–18.1)
MCH, POC: 28.8 pg (ref 27–31.2)
MCHC: 31.6 g/dL — AB (ref 31.8–35.4)
MCV: 91.3 fL (ref 80–97)
RBC: 3.8 M/uL — AB (ref 4.69–6.13)

## 2013-03-31 MED ORDER — TESTOSTERONE 40.5 MG/2.5GM (1.62%) TD GEL: 2.0000 | Freq: Every day | TRANSDERMAL | Status: DC

## 2013-03-31 NOTE — Patient Instructions (Addendum)
Diets for Diabetes, Food Labeling Look at food labels to help you decide how much of a product you can eat. You will want to check the amount of total carbohydrate in a serving to see how the food fits into your meal plan. In the list of ingredients, the ingredient present in the largest amount by weight must be listed first, followed by the other ingredients in descending order. STANDARD OF IDENTITY Most products have a list of ingredients. However, foods that the Food and Drug Administration (FDA) has given a standard of identity do not need a list of ingredients. A standard of identity means that a food must contain certain ingredients if it is called a particular name. Examples are mayonnaise, peanut butter, ketchup, jelly, and cheese. LABELING TERMS There are many terms found on food labels. Some of these terms have specific definitions. Some terms are regulated by the FDA, and the FDA has clearly specified how they can be used. Others are not regulated or well-defined and can be misleading and confusing. SPECIFICALLY DEFINED TERMS Nutritive Sweetener.  A sweetener that contains calories,such as table sugar or honey. Nonnutritive Sweetener.  A sweetener with few or no calories,such as saccharin, aspartame, sucralose, and cyclamate. LABELING TERMS REGULATED BY THE FDA Free.  The product contains only a tiny or small amount of fat, cholesterol, sodium, sugar, or calories. For example, a "fat-free" product will contain less than 0.5 g of fat per serving. Low.  A food described as "low" in fat, saturated fat, cholesterol, sodium, or calories could be eaten fairly often without exceeding dietary guidelines. For example, "low in fat" means no more than 3 g of fat per serving. Lean.  "Lean" and "extra lean" are U.S. Department of Agriculture (USDA) terms for use on meat and poultry products. "Lean" means the product contains less than 10 g of fat, 4 g of saturated fat, and 95 mg of cholesterol  per serving. "Lean" is not as low in fat as a product labeled "low." Extra Lean.  "Extra lean" means the product contains less than 5 g of fat, 2 g of saturated fat, and 95 mg of cholesterol per serving. While "extra lean" has less fat than "lean," it is still higher in fat than a product labeled "low." Reduced, Less, Fewer.  A diet product that contains 25% less of a nutrient or calories than the regular version. For example, hot dogs might be labeled "25% less fat than our regular hot dogs." Light/Lite.  A diet product that contains  fewer calories or  the fat of the original. For example, "light in sodium" means a product with  the usual sodium. More.  One serving contains at least 10% more of the daily value of a vitamin, mineral, or fiber than usual. Good Source Of.  One serving contains 10% to 19% of the daily value for a particular vitamin, mineral, or fiber. Excellent Source Of.  One serving contains 20% or more of the daily value for a particular nutrient. Other terms used might be "high in" or "rich in." Enriched or Fortified.  The product contains added vitamins, minerals, or protein. Nutrition labeling must be used on enriched or fortified foods. Imitation.  The product has been altered so that it is lower in protein, vitamins, or minerals than the usual food,such as imitation peanut butter. Total Fat.  The number listed is the total of all fat found in a serving of the product. Under total fat, food labels must list saturated fat and   trans fat, which are associated with raising bad cholesterol and an increased risk of heart blood vessel disease. Saturated Fat.  Mainly fats from animal-based sources. Some examples are red meat, cheese, cream, whole milk, and coconut oil. Trans Fat.  Found in some fried snack foods, packaged foods, and fried restaurant foods. It is recommended you eat as close to 0 g of trans fat as possible, since it raises bad cholesterol and lowers  good cholesterol. Polyunsaturated and Monounsaturated Fats.  More healthful fats. These fats are from plant sources. Total Carbohydrate.  The number of carbohydrate grams in a serving of the product. Under total carbohydrate are listed the other carbohydrate sources, such as dietary fiber and sugars. Dietary Fiber.  A carbohydrate from plant sources. Sugars.  Sugars listed on the label contain all naturally occurring sugars as well as added sugars. LABELING TERMS NOT REGULATED BY THE FDA Sugarless.  Table sugar (sucrose) has not been added. However, the manufacturer may use another form of sugar in place of sucrose to sweeten the product. For example, sugar alcohols are used to sweeten foods. Sugar alcohols are a form of sugar but are not table sugar. If a product contains sugar alcohols in place of sucrose, it can still be labeled "sugarless." Low Salt, Salt-Free, Unsalted, No Salt, No Salt Added, Without Added Salt.  Food that is usually processed with salt has been made without salt. However, the food may contain sodium-containing additives, such as preservatives, leavening agents, or flavorings. Natural.  This term has no legal meaning. Organic.  Foods that are certified as organic have been inspected and approved by the USDA to ensure they are produced without pesticides, fertilizers containing synthetic ingredients, bioengineering, or ionizing radiation. Document Released: 11/07/2003 Document Revised: 01/27/2012 Document Reviewed: 05/25/2009 Exit[employer] Patient Information 2013 [employer].  

## 2013-03-31 NOTE — Progress Notes (Signed)
Subjective:    Patient ID: [name], male    DOB: 1946-05-16, 67 y.o.   MRN: [id number]  Diabetes He presents for his follow-up diabetic visit. He has type 2 diabetes mellitus. No MedicAlert identification noted. The initial diagnosis of diabetes was made 30 years ago. His disease course has been stable. There are no hypoglycemic associated symptoms. Pertinent negatives for hypoglycemia include no confusion or pallor. Pertinent negatives for diabetes include no chest pain, no foot paresthesias, no foot ulcerations, no polydipsia, no polyphagia, no polyuria, no visual change, no weakness and no weight loss. There are no hypoglycemic complications. Symptoms are stable. Diabetic complications include heart disease, impotence and peripheral neuropathy (mild). Risk factors for coronary artery disease include male sex, dyslipidemia and hypertension. Current diabetic treatment includes insulin injections. He is compliant with treatment all of the time. Insulin dose schedule: lantus in AM, and sliding scale novolog at meals. Rotation sites for injection include the abdominal wall. His weight is stable. When asked about meal planning, he reported none. He has not had a previous visit with a dietician. He rarely participates in exercise. His home blood glucose trend is fluctuating dramatically. (Blood sugar range from low 100's to low 400's) An ACE inhibitor/angiotensin II receptor blocker is being taken. He does not see a podiatrist.Eye exam is current (06/2012).  Hypertension This is a chronic problem. The current episode started more than 1 year ago. The problem is unchanged. The problem is controlled. Pertinent negatives include no chest pain, neck pain, orthopnea, palpitations, peripheral edema or shortness of breath. There are no associated agents to hypertension. Risk factors for coronary artery disease include diabetes mellitus, dyslipidemia and male gender. Past treatments include ACE inhibitors. The  current treatment provides significant improvement. Compliance problems include diet and exercise.   Anemia Presents for follow-up visit. There has been no abdominal pain, confusion, leg swelling, light-headedness, pallor, palpitations or weight loss. Procedure history includes EGD (years ago). Compliance problems include adherence to diet.  Compliance with medications is 76-100%. Compliance with diet is 26-50%. Side effects of medications include auditory problems.  Gastrophageal Reflux He reports no abdominal pain, no chest pain, no coughing, no dysphagia, no heartburn or no sore throat. This is a chronic problem. The current episode started more than 1 year ago. The problem occurs rarely. The problem has been rapidly improving. Nothing aggravates the symptoms. Associated symptoms include anemia. Pertinent negatives include no weight loss. Risk factors include ETOH use. He has tried a PPI for the symptoms. The treatment provided moderate relief. Past procedures include an EGD (years ago).  Hyperlipidemia This is a chronic problem. The current episode started more than 1 year ago. The problem is controlled. Recent lipid tests were reviewed and are low. Exacerbating diseases include diabetes. Pertinent negatives include no chest pain or shortness of breath. He is currently on no antihyperlipidemic treatment. The current treatment provides significant improvement of lipids. There are no compliance problems.  Risk factors for coronary artery disease include diabetes mellitus, hypertension and male sex.  PVD Plavix dialy- no bleeding that he is aware of . No longer sees PV surgeon    Review of Systems  Constitutional: Negative for weight loss.  HENT: Negative for sore throat and neck pain.   Respiratory: Negative for cough and shortness of breath.   Cardiovascular: Negative for chest pain, palpitations and orthopnea.  Gastrointestinal: Negative for heartburn, dysphagia and abdominal pain.  Endocrine:  Negative for polydipsia, polyphagia and polyuria.  Genitourinary: Positive for impotence.  Skin: Negative for pallor.  Neurological: Negative for weakness and light-headedness.  Psychiatric/Behavioral: Negative for confusion.  All other systems reviewed and are negative.       Objective:   Physical Exam  Constitutional: He is oriented to person, place, and time. He appears well-developed and well-nourished.  HENT:  Head: Normocephalic.  Right Ear: External ear normal.  Left Ear: External ear normal.  Nose: Nose normal.  Mouth/Throat: Oropharynx is clear and moist.  Eyes: EOM are normal. Pupils are equal, round, and reactive to light.  Neck: Normal range of motion. Neck supple. No thyromegaly present.  Cardiovascular: Normal rate, regular rhythm, normal heart sounds and intact distal pulses.   No murmur heard. Pulmonary/Chest: Effort normal and breath sounds normal. He has no wheezes. He has no rales.  Abdominal: Soft. Bowel sounds are normal.  Genitourinary: Prostate normal and penis normal.  Musculoskeletal: Normal range of motion.  Neurological: He is alert and oriented to person, place, and time.  Positive 3/4 monofilament bil feet- unable to feel bil great toe.  Skin: Skin is warm and dry.  Slight callus formation bil heels  Psychiatric: He has a normal mood and affect. His behavior is normal. Judgment and thought content normal.   BP 131/66  Pulse 75  Temp(Src) 96.8 F (36 C) (Oral)  Ht 5' 11.25" (1.81 m)  Wt 162 lb (73.483 kg)  BMI 22.43 kg/m2 Results for orders placed in visit on 03/31/13  POCT GLYCOSYLATED HEMOGLOBIN (HGB A1C)      Result Value Range   Hemoglobin A1C 6.9%    POCT CBC      Result Value Range   WBC 6.6  4.6 - 10.2 K/uL   Lymph, poc 1.6  0.6 - 3.4   POC LYMPH PERCENT 23.9  10 - 50 %L   POC Granulocyte 4.6  2 - 6.9   Granulocyte percent 70.3  37 - 80 %G   RBC 3.8 (*) 4.69 - 6.13 M/uL   Hemoglobin 10.9 (*) 14.1 - 18.1 g/dL   HCT, POC 16.1 (*)  09.6 - 53.7 %   MCV 91.3  80 - 97 fL   MCH, POC 28.8  27 - 31.2 pg   MCHC 31.6 (*) 31.8 - 35.4 g/dL   RDW, POC 04.5     Platelet Count, POC 418.0  142 - 424 K/uL   MPV 7.3  0 - 99.8 fL         Assessment & Plan:  1. Diabetes Continue low carb diet - POCT glycosylated hemoglobin (Hb A1C) - NMR Lipoprofile with Lipids - COMPLETE METABOLIC PANEL WITH GFR  2. Anemia Continue iron tablets as Rx - CBC with Differential - POCT CBC  3. HTN (hypertension) Low NA+ diet and exercise - NMR Lipoprofile with Lipids - COMPLETE METABOLIC PANEL WITH GFR - CBC with Differential - Thyroid Panel With TSH  4. Other and unspecified hyperlipidemia Low fat diet and exercise - POCT glycosylated hemoglobin (Hb A1C) - NMR Lipoprofile with Lipids - COMPLETE METABOLIC PANEL WITH GFR - CBC with Differential - Thyroid Panel With TSH  5. Hypogonadism male  Need to get back on testosterone - Testosterone, Total & Free Direct - Testosterone (ANDROGEL) 40.5 MG/2.5GM (1.62%) GEL; Place 2 Squirts onto the skin daily.  Dispense: 2.5 g; Refill: 5  Mary-Margaret Daphine Deutscher, FNP

## 2013-04-02 LAB — NMR LIPOPROFILE WITH LIPIDS
Cholesterol, Total: 175 mg/dL (ref <200)
HDL Particle Number: 35.8 umol/L (ref >=30.5)
LDL Particle Number: 839 nmol/L (ref <1000)
Large HDL-P: 12.7 umol/L (ref >=4.8)
Large VLDL-P: 1.2 nmol/L (ref <=2.7)
Small LDL Particle Number: 211 nmol/L (ref <=527)

## 2013-04-20 ENCOUNTER — Telehealth: Payer: Self-pay | Admitting: Nurse Practitioner

## 2013-04-20 ENCOUNTER — Other Ambulatory Visit: Payer: Self-pay | Admitting: Nurse Practitioner

## 2013-04-20 DIAGNOSIS — M25551 Pain in right hip: Secondary | ICD-10-CM

## 2013-04-21 MED ORDER — OXYCODONE-ACETAMINOPHEN 10-325 MG PO TABS: 1.0000 | ORAL_TABLET | Freq: Four times a day (QID) | ORAL | Status: DC | PRN

## 2013-04-21 NOTE — Telephone Encounter (Signed)
Last seen 03/31/13, last filled 03/23/13. Call pt at [phone] to pick up

## 2013-04-21 NOTE — Telephone Encounter (Signed)
rx ready for pickup 

## 2013-04-22 ENCOUNTER — Telehealth: Payer: Self-pay | Admitting: Family Medicine

## 2013-04-22 NOTE — Telephone Encounter (Signed)
SCRIPT GIVEN TO PT

## 2013-04-22 NOTE — Telephone Encounter (Signed)
Left message on machine, that pt needs to pickup rx here

## 2013-04-27 ENCOUNTER — Other Ambulatory Visit: Payer: Self-pay | Admitting: Nurse Practitioner

## 2013-04-27 NOTE — Telephone Encounter (Signed)
error 

## 2013-05-17 ENCOUNTER — Ambulatory Visit: Payer: Self-pay

## 2013-05-18 ENCOUNTER — Ambulatory Visit (INDEPENDENT_AMBULATORY_CARE_PROVIDER_SITE_OTHER): Payer: Medicare HMO | Admitting: Family Medicine

## 2013-05-18 ENCOUNTER — Encounter (HOSPITAL_COMMUNITY): Payer: Self-pay | Admitting: *Deleted

## 2013-05-18 ENCOUNTER — Telehealth: Payer: Self-pay | Admitting: Nurse Practitioner

## 2013-05-18 ENCOUNTER — Encounter: Payer: Self-pay | Admitting: Family Medicine

## 2013-05-18 ENCOUNTER — Observation Stay (HOSPITAL_COMMUNITY)
Admission: EM | Admit: 2013-05-18 | Discharge: 2013-05-19 | Disposition: A | Discharge status: Home / Self Care | Payer: Medicare HMO | Attending: Internal Medicine | Admitting: Internal Medicine

## 2013-05-18 ENCOUNTER — Encounter: Payer: Self-pay | Admitting: *Deleted

## 2013-05-18 VITALS — BP 126/71 | HR 77 | Temp 97.0°F | Wt 159.2 lb

## 2013-05-18 DIAGNOSIS — M25551 Pain in right hip: Secondary | ICD-10-CM

## 2013-05-18 DIAGNOSIS — R531 Weakness: Secondary | ICD-10-CM

## 2013-05-18 DIAGNOSIS — I739 Peripheral vascular disease, unspecified: Secondary | ICD-10-CM | POA: Diagnosis present

## 2013-05-18 DIAGNOSIS — Z794 Long term (current) use of insulin: Secondary | ICD-10-CM | POA: Insufficient documentation

## 2013-05-18 DIAGNOSIS — I70219 Atherosclerosis of native arteries of extremities with intermittent claudication, unspecified extremity: Secondary | ICD-10-CM

## 2013-05-18 DIAGNOSIS — R0602 Shortness of breath: Secondary | ICD-10-CM | POA: Diagnosis present

## 2013-05-18 DIAGNOSIS — D649 Anemia, unspecified: Secondary | ICD-10-CM

## 2013-05-18 DIAGNOSIS — R5383 Other fatigue: Secondary | ICD-10-CM

## 2013-05-18 DIAGNOSIS — M25552 Pain in left hip: Secondary | ICD-10-CM

## 2013-05-18 DIAGNOSIS — D509 Iron deficiency anemia, unspecified: Principal | ICD-10-CM | POA: Diagnosis present

## 2013-05-18 DIAGNOSIS — I10 Essential (primary) hypertension: Secondary | ICD-10-CM | POA: Diagnosis present

## 2013-05-18 DIAGNOSIS — E119 Type 2 diabetes mellitus without complications: Secondary | ICD-10-CM | POA: Diagnosis present

## 2013-05-18 DIAGNOSIS — R9431 Abnormal electrocardiogram [ECG] [EKG]: Secondary | ICD-10-CM | POA: Diagnosis present

## 2013-05-18 DIAGNOSIS — K219 Gastro-esophageal reflux disease without esophagitis: Secondary | ICD-10-CM | POA: Diagnosis present

## 2013-05-18 DIAGNOSIS — Z79899 Other long term (current) drug therapy: Secondary | ICD-10-CM | POA: Insufficient documentation

## 2013-05-18 HISTORY — DX: Gastro-esophageal reflux disease without esophagitis: K21.9

## 2013-05-18 HISTORY — DX: Shortness of breath: R06.02

## 2013-05-18 HISTORY — DX: Pneumonia, unspecified organism: J18.9

## 2013-05-18 LAB — DIRECT ANTIGLOBULIN TEST (NOT AT ARMC): DAT, IgG: NEGATIVE

## 2013-05-18 LAB — FERRITIN: Ferritin: 4 ng/mL — ABNORMAL LOW (ref 22–322)

## 2013-05-18 LAB — POCT CBC
Granulocyte percent: 64 %G (ref 37–80)
HCT, POC: 21.2 % — AB (ref 43.5–53.7)
Hemoglobin: 6.7 g/dL — AB (ref 14.1–18.1)
MPV: 7 fL (ref 0–99.8)
POC Granulocyte: 3.2 (ref 2–6.9)
POC LYMPH PERCENT: 28.8 %L (ref 10–50)
RBC: 2.9 M/uL — AB (ref 4.69–6.13)

## 2013-05-18 LAB — IRON AND TIBC
Iron: 10 ug/dL — ABNORMAL LOW (ref 42–135)
Saturation Ratios: 3 % — ABNORMAL LOW (ref 20–55)
TIBC: 387 ug/dL (ref 215–435)
UIBC: 377 ug/dL (ref 125–400)

## 2013-05-18 LAB — POCT I-STAT, CHEM 8
BUN: 16 mg/dL (ref 6–23)
HCT: 22 % — ABNORMAL LOW (ref 39.0–52.0)
Sodium: 138 mEq/L (ref 135–145)
TCO2: 24 mmol/L (ref 0–100)

## 2013-05-18 LAB — PREPARE RBC (CROSSMATCH)

## 2013-05-18 LAB — RETICULOCYTES: Retic Ct Pct: 2.2 % (ref 0.4–3.1)

## 2013-05-18 LAB — BASIC METABOLIC PANEL
BUN: 16 mg/dL (ref 6–23)
CO2: 25 mEq/L (ref 19–32)
Chloride: 101 mEq/L (ref 96–112)
Creatinine, Ser: 0.93 mg/dL (ref 0.50–1.35)

## 2013-05-18 LAB — POCT I-STAT TROPONIN I: Troponin i, poc: 0.02 ng/mL (ref 0.00–0.08)

## 2013-05-18 LAB — CBC
HCT: 21.1 % — ABNORMAL LOW (ref 39.0–52.0)
MCV: 75.1 fL — ABNORMAL LOW (ref 78.0–100.0)
RBC: 2.81 MIL/uL — ABNORMAL LOW (ref 4.22–5.81)
WBC: 4.6 10*3/uL (ref 4.0–10.5)

## 2013-05-18 LAB — GLUCOSE, CAPILLARY
Glucose-Capillary: 199 mg/dL — ABNORMAL HIGH (ref 70–99)
Glucose-Capillary: 215 mg/dL — ABNORMAL HIGH (ref 70–99)

## 2013-05-18 LAB — CG4 I-STAT (LACTIC ACID): Lactic Acid, Venous: 1.29 mmol/L (ref 0.5–2.2)

## 2013-05-18 MED ORDER — PANTOPRAZOLE SODIUM 40 MG PO TBEC
80.0000 mg | DELAYED_RELEASE_TABLET | Freq: Every day | ORAL | Status: DC
  Administered 2013-05-19: 80 mg via ORAL
  Filled 2013-05-18: qty 2

## 2013-05-18 MED ORDER — INSULIN ASPART 100 UNIT/ML ~~LOC~~ SOLN
0.0000 [IU] | Freq: Every day | SUBCUTANEOUS | Status: DC
  Administered 2013-05-18: 2 [IU] via SUBCUTANEOUS

## 2013-05-18 MED ORDER — OXYCODONE-ACETAMINOPHEN 10-325 MG PO TABS: 1.0000 | ORAL_TABLET | Freq: Four times a day (QID) | ORAL | Status: DC | PRN

## 2013-05-18 MED ORDER — FLUTICASONE PROPIONATE 50 MCG/ACT NA SUSP
2.0000 | NASAL | Status: DC | PRN
  Filled 2013-05-18: qty 16

## 2013-05-18 MED ORDER — HYDROMORPHONE HCL PF 1 MG/ML IJ SOLN: 1.0000 mg | INTRAMUSCULAR | Status: DC | PRN

## 2013-05-18 MED ORDER — CLOPIDOGREL BISULFATE 75 MG PO TABS
75.0000 mg | ORAL_TABLET | Freq: Every day | ORAL | Status: DC
Start: 2013-05-19 — End: 2013-05-19
  Administered 2013-05-19: 75 mg via ORAL
  Filled 2013-05-18: qty 1

## 2013-05-18 MED ORDER — LISINOPRIL 2.5 MG PO TABS
2.5000 mg | ORAL_TABLET | Freq: Every day | ORAL | Status: DC
Start: 2013-05-19 — End: 2013-05-19
  Administered 2013-05-19: 2.5 mg via ORAL
  Filled 2013-05-18: qty 1

## 2013-05-18 MED ORDER — SODIUM CHLORIDE 0.9 % IJ SOLN
3.0000 mL | Freq: Two times a day (BID) | INTRAMUSCULAR | Status: DC
  Administered 2013-05-18 – 2013-05-19 (×2): 3 mL via INTRAVENOUS

## 2013-05-18 MED ORDER — ONDANSETRON HCL 4 MG PO TABS: 4.0000 mg | ORAL_TABLET | Freq: Four times a day (QID) | ORAL | Status: DC | PRN

## 2013-05-18 MED ORDER — OXYCODONE-ACETAMINOPHEN 5-325 MG PO TABS: 1.0000 | ORAL_TABLET | Freq: Four times a day (QID) | ORAL | Status: DC | PRN

## 2013-05-18 MED ORDER — ONDANSETRON HCL 4 MG/2ML IJ SOLN: 4.0000 mg | Freq: Four times a day (QID) | INTRAMUSCULAR | Status: DC | PRN

## 2013-05-18 MED ORDER — ACETAMINOPHEN 650 MG RE SUPP: 650.0000 mg | Freq: Four times a day (QID) | RECTAL | Status: DC | PRN

## 2013-05-18 MED ORDER — OXYCODONE HCL 5 MG PO TABS: 5.0000 mg | ORAL_TABLET | Freq: Four times a day (QID) | ORAL | Status: DC | PRN

## 2013-05-18 MED ORDER — INSULIN GLARGINE 100 UNIT/ML ~~LOC~~ SOLN
20.0000 [IU] | Freq: Every day | SUBCUTANEOUS | Status: DC
  Administered 2013-05-19: 20 [IU] via SUBCUTANEOUS
  Filled 2013-05-18: qty 0.2

## 2013-05-18 MED ORDER — ACETAMINOPHEN 325 MG PO TABS: 650.0000 mg | ORAL_TABLET | Freq: Four times a day (QID) | ORAL | Status: DC | PRN

## 2013-05-18 MED ORDER — SODIUM CHLORIDE 0.9 % IV SOLN: INTRAVENOUS | Status: DC

## 2013-05-18 MED ORDER — VITAMIN C 500 MG PO TABS
500.0000 mg | ORAL_TABLET | Freq: Every day | ORAL | Status: DC
  Administered 2013-05-19: 500 mg via ORAL
  Filled 2013-05-18: qty 1

## 2013-05-18 MED ORDER — INSULIN ASPART 100 UNIT/ML ~~LOC~~ SOLN
0.0000 [IU] | Freq: Three times a day (TID) | SUBCUTANEOUS | Status: DC
  Administered 2013-05-19: 7 [IU] via SUBCUTANEOUS

## 2013-05-18 MED ORDER — VITAMIN D3 25 MCG (1000 UNIT) PO TABS
1000.0000 [IU] | ORAL_TABLET | Freq: Every day | ORAL | Status: DC
  Administered 2013-05-19: 1000 [IU] via ORAL
  Filled 2013-05-18: qty 1

## 2013-05-18 NOTE — H&P (Signed)
History and Physical       Hospital Admission Note Date: 05/18/2013  Patient name: [name] Medical record number: [id number] Date of birth: 1946-02-02 Age: 67 y.o. Gender: male PCP: [name], MD    Chief Complaint:  Sent by PCP for anemia  HPI: Patient is a 67 year old male with a history of IDDM, peripheral vascular disease, anemia, hypertension, GERD was having generalized weakness and shortness of breath worsening in the last 2 weeks. Patient went to see his PCP today, was found to have a hemoglobin of 6.7 and was sent to the ER for further workup. Patient denied any chest pain, reported dizziness, lightheadedness but no syncopal episode. The shortness of breath is worse with exertion. He denied any hematemesis, hematochezia or melena. Patient had an extensive GI workup in 2012  by Dr. Elnoria Howard which was essentially negative  Review of Systems:  Constitutional: Denies fever, chills, diaphoresis, poor appetite, + fatigue.  HEENT: Denies photophobia, eye pain, redness, hearing loss, ear pain, congestion, sore throat, rhinorrhea, sneezing, mouth sores, trouble swallowing, neck pain, neck stiffness and tinnitus.   Respiratory: Denies cough, chest tightness,  and wheezing.  +SOB, DOE Cardiovascular: Denies chest pain, palpitations and leg swelling.  Gastrointestinal: Denies nausea, vomiting, abdominal pain, diarrhea, constipation, blood in stool and abdominal distention.  Genitourinary: Denies dysuria, urgency, frequency, hematuria, flank pain and difficulty urinating.  Musculoskeletal: Denies myalgias, back pain, joint swelling, arthralgias and gait problem.  Skin: Denies pallor, rash and wound.  Neurological: Denies seizures, syncope, numbness and headaches. + dizziness, lightheadedness Hematological: Denies adenopathy. Easy bruising, personal or family bleeding history , please see history of present  illness Psychiatric/Behavioral: Denies suicidal ideation, mood changes, confusion, nervousness, sleep disturbance and agitation  Past Medical History: Past Medical History  Diagnosis Date  . Diabetes mellitus age 69    insulin dependent  . Arthritis   . Reflux   . Constipation 02/12/11  . Leg pain 02/12/11    with walking  . Joint pain 02/12/11    right shoulder  . Hypertension   . Anemia   . Peripheral vascular disease   . Anemia 01/07/2012  . History of tobacco abuse 01/07/2012   Past Surgical History  Procedure Laterality Date  . Total hip arthroplasty      left hip  . Pr vein bypass graft,aorto-fem-pop  03-06-11    Right Common Femoral to BK popliteal BPG  . Hernia repair  1953    Medications: Prior to Admission medications   Medication Sig Start Date End Date Taking? Authorizing Provider  Aspirin-Acetaminophen (GOODYS BODY PAIN PO) Take 1 packet by mouth as needed (pain).   Yes Historical Provider, MD  cholecalciferol (VITAMIN D) 1000 UNITS tablet Take 1,000 Units by mouth daily.   Yes Historical Provider, MD  clopidogrel (PLAVIX) 75 MG tablet Take 75 mg by mouth daily.   Yes Historical Provider, MD  Ferrous Sulfate (IRON) 325 (65 FE) MG TABS Take 1 tablet by mouth daily.    Yes Historical Provider, MD  fluticasone (FLONASE) 50 MCG/ACT nasal spray Place 2 sprays into the nose as needed for allergies.    Yes Historical Provider, MD  insulin aspart (NOVOLOG) 100 UNIT/ML injection Inject 0-18 Units into the skin 3 (three) times daily before meals. Per patient's sliding scale   Yes Historical Provider, MD  insulin glargine (LANTUS) 100 UNIT/ML injection Inject 0.2 mLs (20 Units total) into the skin daily. Inject up to 20 units qd ad 02/18/13  Yes Henrene Pastor, PHARMD  lisinopril (PRINIVIL,ZESTRIL) 2.5 MG tablet Take 2.5 mg by mouth daily.   Yes Historical Provider, MD  omeprazole (PRILOSEC) 40 MG capsule Take 40 mg by mouth daily.   Yes Historical Provider, MD  ONE TOUCH ULTRA  TEST test strip 1 each by Other route 3 (three) times daily.  09/27/11  Yes Historical Provider, MD  oxyCODONE-acetaminophen (PERCOCET) 10-325 MG per tablet Take 1 tablet by mouth every 6 (six) hours as needed for pain. 05/18/13  Yes Mary-Margaret Daphine Deutscher, FNP  RELION INSULIN SYR 0.5ML/31G 31G X 5/16" 0.5 ML MISC Inject 1 Syringe into the skin 3 (three) times daily.  09/27/11  Yes Historical Provider, MD  tadalafil (CIALIS) 10 MG tablet Take 10 mg by mouth daily as needed for erectile dysfunction.   Yes Historical Provider, MD  Testosterone (ANDROGEL) 40.5 MG/2.5GM (1.62%) GEL Place 2 Squirts onto the skin daily. 03/31/13  Yes Mary-Margaret Daphine Deutscher, FNP  vitamin C (ASCORBIC ACID) 500 MG tablet Take 500 mg by mouth daily.     Yes Historical Provider, MD    Allergies:  No Known Allergies  Social History:  reports that he quit smoking about 3 years ago. His smoking use included Cigarettes. He has a 120 pack-year smoking history. He has never used smokeless tobacco. He reports that he drinks about 7.2 ounces of alcohol per week. He reports that he does not use illicit drugs.  Family History: Family History  Problem Relation Age of Onset  . Cancer Mother     Physical Exam: Blood pressure 127/70, pulse 80, temperature 98.2 F (36.8 C), temperature source Oral, resp. rate 17, SpO2 100.00%. General: Alert, awake, oriented x3, in no acute distress. HEENT: normocephalic, atraumatic, anicteric sclera, pale conjunctiva, pupils equal and reactive to light and accomodation, oropharynx clear Neck: supple, no masses or lymphadenopathy, no goiter, no bruits  Heart: Regular rate and rhythm, without murmurs, rubs or gallops. Lungs: Clear to auscultation bilaterally, no wheezing, rales or rhonchi. Abdomen: Soft, nontender, nondistended, positive bowel sounds, no masses. Extremities: No clubbing, cyanosis or edema with positive pedal pulses. Neuro: Grossly intact, no focal neurological deficits, strength 5/5 upper  and lower extremities bilaterally Psych: alert and oriented x 3, normal mood and affect Skin: no rashes or lesions, warm and dry   LABS on Admission:  Basic Metabolic Panel:  Recent Labs Lab 05/18/13 1526 05/18/13 1623  NA 134* 138  K 4.1 4.4  CL 101 103  CO2 25  --   GLUCOSE 260* 261*  BUN 16 16  CREATININE 0.93 1.00  CALCIUM 8.7  --    CBC:  Recent Labs Lab 05/18/13 1236 05/18/13 1526 05/18/13 1623  WBC 5.0 4.6  --   HGB 6.7* 6.3* 7.5*  HCT 21.2* 21.1* 22.0*  MCV 72.8* 75.1*  --   PLT  --  351  --    Cardiac Enzymes: No results found for this basename: CKTOTAL, CKMB, CKMBINDEX, TROPONINI,  in the last 168 hours BNP: No components found with this basename: POCBNP,  CBG:  Recent Labs Lab 05/18/13 1633  GLUCAP 199*     Radiological Exams on Admission: No results found.  Assessment/Plan Principal Problem:   Anemia: Unclear etiology, Patient has a history of iron deficiency anemia with extensive GI workup in 2012. EGD in 9/12: Showed hiatal hernia but otherwise normal. Colonoscopy in 07/2011 showed 4-66mm polyp (benign on pathology) otherwise normal. Capsule endoscopy in 08/2011 showed no evidence of ulceration, inflammation/erosion or any vascular abnormality. Stool occult test in ED was negative -  Admit for observation, obtain anemia profile, haptoglobin, LDH, reticulocyte count, Coombs test for further workup. Will repeat FOB T.  - Transfuse 2 units of packed RBCs, hematology consultation pending results   Active Problems:   PVD (peripheral vascular disease): Continue Plavix  - Cardiac MRI in 2013 showed EF of 52%, abnormal septal motion    DM (diabetes mellitus) - Place on Lantus and sliding scale insulin    HTN (hypertension) - Currently stable, continue lisinopril    GERD (gastroesophageal reflux disease) - Continue PPI  DVT prophylaxis: SCDs  CODE STATUS: Full CODE STATUS  Further plan will depend as patient's clinical course evolves and  further radiologic and laboratory data become available.   Time Spent on Admission: 1 hour  RAI,RIPUDEEP M.D. [facility] 05/18/2013, 5:45 PM Pager: [phone]  If 7PM-7AM, please contact night-coverage [email or web address] Password [email or web address]

## 2013-05-18 NOTE — Telephone Encounter (Signed)
Refill

## 2013-05-18 NOTE — Telephone Encounter (Signed)
Pt requesting med refill

## 2013-05-18 NOTE — ED Notes (Signed)
Pt has history of anemia.  Saw PMD and reported weakness, dizziness, and felt like he was going to pass out.  Pt hgb came back at 6.7 and sent here for transfusions

## 2013-05-18 NOTE — ED Provider Notes (Signed)
History    CSN: [id number] Arrival date & time 05/18/13  1516  First MD Initiated Contact with Patient 05/18/13 1528     Chief Complaint  Patient presents with  . Shortness of Breath  . Weakness   (Consider location/radiation/quality/duration/timing/severity/associated sxs/prior Treatment) HPI Comments: Patient presents to the ER for evaluation after seeing his primary care doctor. Patient has been experiencing generalized weakness for several weeks. Patient reports that his weakness worsens when he gets up and moves, has had associated dizziness and shortness of breath with exertion. He has had episodes of near-syncope. He was seen by his primary doctor, blood work showed that he was anemic. He was sent to the ER for further evaluation. Patient reports an episode of symptomatic anemia approximately 2 years ago, reports that his GI workup did not show any reason for the anemia. Denies associated chest pain. He has not seen any blood in his stool or urine. No melena.  Patient is a 67 y.o. male presenting with shortness of breath and weakness.  Shortness of Breath Associated symptoms: no abdominal pain and no chest pain   Weakness Associated symptoms include shortness of breath. Pertinent negatives include no chest pain and no abdominal pain.   Past Medical History  Diagnosis Date  . Diabetes mellitus age 30    insulin dependent  . Arthritis   . Reflux   . Constipation 02/12/11  . Leg pain 02/12/11    with walking  . Joint pain 02/12/11    right shoulder  . Hypertension   . Anemia   . Peripheral vascular disease   . Anemia 01/07/2012  . History of tobacco abuse 01/07/2012   Past Surgical History  Procedure Laterality Date  . Total hip arthroplasty      left hip  . Pr vein bypass graft,aorto-fem-pop  03-06-11    Right Common Femoral to BK popliteal BPG  . Hernia repair  1953   Family History  Problem Relation Age of Onset  . Cancer Mother    History  Substance Use Topics   . Smoking status: Former Smoker -- 3.00 packs/day for 40 years    Types: Cigarettes    Quit date: 01/16/2010  . Smokeless tobacco: Never Used  . Alcohol Use: 7.2 oz/week    12 Cans of beer per week    Review of Systems  Respiratory: Positive for shortness of breath.   Cardiovascular: Negative for chest pain.  Gastrointestinal: Negative for abdominal pain and anal bleeding.  Neurological: Positive for dizziness and weakness.  All other systems reviewed and are negative.    Allergies  Review of patient's allergies indicates no known allergies.  Home Medications   Current Outpatient Rx  Name  Route  Sig  Dispense  Refill  . Aspirin-Acetaminophen (GOODYS BODY PAIN PO)   Oral   Take 1 packet by mouth as needed (pain).         . cholecalciferol (VITAMIN D) 1000 UNITS tablet   Oral   Take 1,000 Units by mouth daily.         . clopidogrel (PLAVIX) 75 MG tablet   Oral   Take 75 mg by mouth daily.         . Ferrous Sulfate (IRON) 325 (65 FE) MG TABS   Oral   Take 1 tablet by mouth daily.          . fluticasone (FLONASE) 50 MCG/ACT nasal spray   Nasal   Place 2 sprays into the nose as needed  for allergies.          Marland Kitchen insulin aspart (NOVOLOG) 100 UNIT/ML injection   Subcutaneous   Inject 0-18 Units into the skin 3 (three) times daily before meals. Per patient's sliding scale         . insulin glargine (LANTUS) 100 UNIT/ML injection   Subcutaneous   Inject 0.2 mLs (20 Units total) into the skin daily. Inject up to 20 units qd ad   30 mL   0   . lisinopril (PRINIVIL,ZESTRIL) 2.5 MG tablet   Oral   Take 2.5 mg by mouth daily.         Marland Kitchen omeprazole (PRILOSEC) 40 MG capsule   Oral   Take 40 mg by mouth daily.         . ONE TOUCH ULTRA TEST test strip   Other   1 each by Other route 3 (three) times daily.          Marland Kitchen oxyCODONE-acetaminophen (PERCOCET) 10-325 MG per tablet   Oral   Take 1 tablet by mouth every 6 (six) hours as needed for pain.   120  tablet   0     DO NOT FILL TILL 05/20/13   . RELION INSULIN SYR 0.5ML/31G 31G X 5/16" 0.5 ML MISC   Intradermal   Inject 1 Syringe into the skin 3 (three) times daily.          . tadalafil (CIALIS) 10 MG tablet   Oral   Take 10 mg by mouth daily as needed for erectile dysfunction.         . Testosterone (ANDROGEL) 40.5 MG/2.5GM (1.62%) GEL   Transdermal   Place 2 Squirts onto the skin daily.   2.5 g   5   . vitamin C (ASCORBIC ACID) 500 MG tablet   Oral   Take 500 mg by mouth daily.            BP 155/62  Pulse 88  Temp(Src) 97.8 F (36.6 C) (Oral)  Resp 18  SpO2 100% Physical Exam  Constitutional: He is oriented to person, place, and time. He appears well-developed and well-nourished. No distress.  HENT:  Head: Normocephalic and atraumatic.  Right Ear: Hearing normal.  Left Ear: Hearing normal.  Nose: Nose normal.  Mouth/Throat: Oropharynx is clear and moist and mucous membranes are normal.  Eyes: Conjunctivae and EOM are normal. Pupils are equal, round, and reactive to light.  Neck: Normal range of motion. Neck supple.  Cardiovascular: Regular rhythm, S1 normal and S2 normal.  Exam reveals no gallop and no friction rub.   No murmur heard. Pulmonary/Chest: Effort normal and breath sounds normal. No respiratory distress. He exhibits no tenderness.  Abdominal: Soft. Normal appearance and bowel sounds are normal. There is no hepatosplenomegaly. There is no tenderness. There is no rebound, no guarding, no tenderness at McBurney's point and negative Murphy's sign. No hernia.  Musculoskeletal: Normal range of motion.  Neurological: He is alert and oriented to person, place, and time. He has normal strength. No cranial nerve deficit or sensory deficit. Coordination normal. GCS eye subscore is 4. GCS verbal subscore is 5. GCS motor subscore is 6.  Skin: Skin is warm, dry and intact. No rash noted. No cyanosis.  Psychiatric: He has a normal mood and affect. His speech is  normal and behavior is normal. Thought content normal.    ED Course  Procedures (including critical care time)  EKG:  Date: 05/18/2013  Rate: 86  Rhythm: normal sinus  rhythm  QRS Axis: normal  Intervals: normal  ST/T Wave abnormalities: ST depressions inferiorly and ST depressions laterally  Conduction Disutrbances:none  Narrative Interpretation:   Old EKG Reviewed: changes noted and ST changes are new   Labs Reviewed  CBC - Abnormal; Notable for the following:    RBC 2.81 (*)    Hemoglobin 6.3 (*)    HCT 21.1 (*)    MCV 75.1 (*)    MCH 22.4 (*)    MCHC 29.9 (*)    RDW 17.5 (*)    All other components within normal limits  BASIC METABOLIC PANEL - Abnormal; Notable for the following:    Sodium 134 (*)    Glucose, Bld 260 (*)    GFR calc non Af Amer 85 (*)    All other components within normal limits  GLUCOSE, CAPILLARY - Abnormal; Notable for the following:    Glucose-Capillary 199 (*)    All other components within normal limits  POCT I-STAT, CHEM 8 - Abnormal; Notable for the following:    Glucose, Bld 261 (*)    Hemoglobin 7.5 (*)    HCT 22.0 (*)    All other components within normal limits  VITAMIN B12  FOLATE  IRON AND TIBC  FERRITIN  RETICULOCYTES  CG4 I-STAT (LACTIC ACID)  POCT I-STAT TROPONIN I  OCCULT BLOOD, POC DEVICE  TYPE AND SCREEN   No results found.  Diagnosis: 1. Symptomatic anemia 2. EKG changes  MDM  Patient presents to the ER for evaluation of weakness and shortness of breath. Patient saw his primary care physician earlier for this and blood work revealed that he was significantly anemic.repeat vallecular confirms anemia, hemoglobin 6.3. Rectal exam revealed brown stool which was heme-negative. An anemia panel was ordered. Patient will require hospitalization for further workup to determine the source of the anemia as well as blood transfusion.  Patient's EKG is depressions in inferior and lateral leads. His troponin was negative. He is not  experiencing any chest pain currently. Patient will need serial troponins and monitor for any chest pain during hospitalization.   Gilda Crease, MD 05/18/13 1705

## 2013-05-18 NOTE — Telephone Encounter (Signed)
rx given to patient

## 2013-05-18 NOTE — Progress Notes (Addendum)
  Subjective:    Patient ID: [name], male    DOB: 10-26-1946, 67 y.o.   MRN: [id number]  HPI Patient presents today with chief complaint of weakness x2 weeks. Patient has a baseline history of iron deficiency anemia. Patient states she's had progressive generalized fatigue as well as lower extremity weakness and tingling. Also some mild shortness of breath. No chest pains. No dizziness, hemiparesis, confusion, headache. Patient states he had similar symptoms with his last flare of anemia to which she was transfused about 2 years ago. Patient states he had a colonoscopy done that time that was normal. Patient denies any black or tarry stools. Patient denies any recent aspirin use.  Patient has been compliant with his iron use per patient.   Review of Systems  All other systems reviewed and are negative.       Objective:   Physical Exam  Constitutional:  Underweight  HENT:  Head: Normocephalic and atraumatic.  Eyes: Conjunctivae are normal. Pupils are equal, round, and reactive to light.  Neck: Normal range of motion. Neck supple.  Cardiovascular: Normal rate and regular rhythm.   Pulmonary/Chest: Effort normal.  Abdominal: Soft.  Musculoskeletal: Normal range of motion.  Neurological: He is alert.  Skin:  Mild pallor   Lab Results  Component Value Date   WBC 5.0 05/18/2013   HGB 6.7* 05/18/2013   HCT 21.2* 05/18/2013   MCV 72.8* 05/18/2013   PLT 328 01/07/2012       Assessment & Plan:  Weakness - Plan: POCT CBC  Anemia - Plan: POCT CBC  symptomatic anemia Patient meets criteria for transfusion. Recommended EMS transfer however patient refused. AMA paperwork filled. Patient reports that he had a normal colonoscopy about 2 years ago. However, at this point I think the patient should followup with hematology for further evaluation of anemia. Pt refused hemoccult

## 2013-05-18 NOTE — Telephone Encounter (Signed)
appt made for today 

## 2013-05-18 NOTE — Patient Instructions (Addendum)
Discharge Against Medical Advice °I am signing this paper to show that I am leaving this hospital or health care center of my own free will. It is done against all medical advice. In doing so, I am releasing this hospital or health care center and the attending physicians from any and all claims that I may want to make. °I understand that further care has been recommended. My condition may worsen. This could cause me further bodily injury, illness, or even death. I do know that the medical staff has fully explained to me the risk that I am taking in leaving against medical advice. °Document Released: 11/04/2005 Document Revised: 01/27/2012 Document Reviewed: 04/21/2007 °Exit[employer]® Patient Information ©2014 [employer]. ° °

## 2013-05-18 NOTE — Progress Notes (Signed)
Blood consent signed

## 2013-05-18 NOTE — Telephone Encounter (Signed)
Rx ready for pick up. 

## 2013-05-19 ENCOUNTER — Encounter (HOSPITAL_COMMUNITY): Payer: Self-pay | Admitting: General Practice

## 2013-05-19 ENCOUNTER — Telehealth: Payer: Self-pay | Admitting: Nurse Practitioner

## 2013-05-19 DIAGNOSIS — D509 Iron deficiency anemia, unspecified: Secondary | ICD-10-CM

## 2013-05-19 DIAGNOSIS — R0602 Shortness of breath: Secondary | ICD-10-CM | POA: Diagnosis present

## 2013-05-19 DIAGNOSIS — R9431 Abnormal electrocardiogram [ECG] [EKG]: Secondary | ICD-10-CM | POA: Diagnosis present

## 2013-05-19 LAB — BASIC METABOLIC PANEL
CO2: 25 mEq/L (ref 19–32)
Calcium: 8.9 mg/dL (ref 8.4–10.5)
GFR calc non Af Amer: >90 mL/min (ref >90)
Potassium: 4.4 mEq/L (ref 3.5–5.1)
Sodium: 135 mEq/L (ref 135–145)

## 2013-05-19 LAB — HEMOGLOBIN A1C: Hgb A1c MFr Bld: 7.4 % — ABNORMAL HIGH (ref <5.7)

## 2013-05-19 LAB — GLUCOSE, CAPILLARY
Glucose-Capillary: 338 mg/dL — ABNORMAL HIGH (ref 70–99)
Glucose-Capillary: 273 mg/dL — ABNORMAL HIGH (ref 70–99)

## 2013-05-19 LAB — CBC
MCV: 77.4 fL — ABNORMAL LOW (ref 78.0–100.0)
Platelets: 354 10*3/uL (ref 150–400)
RBC: 3.54 MIL/uL — ABNORMAL LOW (ref 4.22–5.81)
WBC: 4.6 10*3/uL (ref 4.0–10.5)

## 2013-05-19 LAB — TSH: TSH: 3.386 u[IU]/mL (ref 0.350–4.500)

## 2013-05-19 MED ORDER — IRON 325 (65 FE) MG PO TABS: 1.0000 | ORAL_TABLET | Freq: Three times a day (TID) | ORAL | Status: DC

## 2013-05-19 MED ORDER — INSULIN GLARGINE 100 UNIT/ML ~~LOC~~ SOLN: 22.0000 [IU] | Freq: Every day | SUBCUTANEOUS | Status: DC

## 2013-05-19 NOTE — Progress Notes (Signed)
Pt has all d/c info and exit care notes. Aware of follow up appts & home meds. Pt says he feels good declined w/c decided to ambulate for discharge. Accompanied by his girlfriend.

## 2013-05-19 NOTE — Progress Notes (Signed)
PT Cancellation Note  Patient Details Name: [name] MRN: [id number] DOB: January 08, 1946   Cancelled Treatment:    Reason Eval/Treat Not Completed: PT screened, no needs identified, will sign off. Pt ambulating in hallways independently. Per Dr. Janee Morn pt refusing all care and is leaving today. Dr. Janee Morn aware there are no skilled PT needs. Please re-consult if skilled PT needed in future. Thanks.   Marcene Brawn 05/19/2013, 10:38 AM  Lewis Shock, PT, DPT Pager #: [phone] Office #: [phone]

## 2013-05-19 NOTE — Progress Notes (Signed)
UR Chart Review Completed  

## 2013-05-19 NOTE — Plan of Care (Signed)
Problem: Phase I Progression Outcomes Goal: Other Phase I Outcomes/Goals Outcome: Completed/Met Date Met:  05/19/13 Exit care notes on Sick day management of diabetes given to pt and went over

## 2013-05-19 NOTE — Progress Notes (Signed)
Follow-up appointment scheduled at  [facility], [address], with Tereso Newcomer PA-C for 06/08/13 at 11:50 am. Will notify Dr. Janee Morn.

## 2013-05-19 NOTE — Progress Notes (Signed)
The second unit of blood was completed.  The patient tolerated the transfusion well.

## 2013-05-19 NOTE — Progress Notes (Signed)
Went over all d/c info with pt.  

## 2013-05-19 NOTE — Progress Notes (Signed)
The first unit of blood was completed.  The patient tolerated the transfusion well.

## 2013-05-19 NOTE — Discharge Summary (Signed)
Physician Discharge Summary  [name]:[id number] DOB: Jan 04, 1946 DOA: 05/18/2013  PCP: [name], MD  Admit date: 05/18/2013 Discharge date: 05/19/2013  Time spent: 70 minutes  Recommendations for Outpatient Follow-up:  1. Patient is to followup with PCP one week post discharge. All followup patient's severe iron deficiency anemia will need to be reassessed and patient may benefit from a hematology referral as GI workup extensively in the past was negative. Patient will need a CBC done to followup on his hemoglobin. Patient's iron tablets have been increased to 3 times daily. 2. Patient is to followup with Tereso Newcomer PA about cardiology on 06/08/2013 at 11:50 AM. Patient will need to followup for followup on his abnormal EKG done during this hospitalization which showed some ST depression in leads V4 through V6 and some slight ST depression in 2,3 and AVF. Patient also noted shortness of breath with activity such as gardening. Patient refused any further cardiac workup during the hospitalization. Patient will need cardiac followup and workup as outpatient.  Discharge Diagnoses:  Principal Problem:   Iron deficiency anemia Active Problems:   PVD (peripheral vascular disease)   DM (diabetes mellitus)   HTN (hypertension)   Peripheral vascular disease, unspecified   GERD (gastroesophageal reflux disease)   SOB (shortness of breath)   Nonspecific abnormal electrocardiogram (ECG) (EKG)   Discharge Condition: Stable and improved  Diet recommendation: Heart healthy/,Carb modified  Filed Weights   05/18/13 1830 05/19/13 0743  Weight: 71.3 kg (157 lb 3 oz) 70.67 kg (155 lb 12.8 oz)    History of present illness:  Patient is a 67 year old male with a history of IDDM, peripheral vascular disease, anemia, hypertension, GERD was having generalized weakness and shortness of breath worsening in the last 2 weeks. Patient went to see his PCP today, was found to have a hemoglobin of 6.7  and was sent to the ER for further workup. Patient denied any chest pain, reported dizziness, lightheadedness but no syncopal episode. The shortness of breath is worse with exertion. He denied any hematemesis, hematochezia or melena.  Patient had an extensive GI workup in 2012 by Dr. Elnoria Howard which was essentially negative      Hospital Course:  #1 severe iron deficiency anemia  Patient presented with generalized weakness and shortness of breath on exertion. Patient noted to be anemic with a hemoglobin of 6.3 on admission. Anemia panel done was consistent with severe iron deficiency anemia with an iron level of 10 and a ferritin of 4 and a folate of 15. Patient with prior GI workup in 2012 with EGD 9 2012 showing hiatal hernia but otherwise normal. Colonoscopy of 9 2012 showed 4-5 mm polyp in final pathology otherwise normal. Capsule endoscopy October 2012 showed no evidence of ulceration, inflammation/erosion or any vascular abnormality. FOBT was negative. Patient is status post 2 units of packed red blood cells. Patient's hemoglobin post transfusion was up to 8.4. Coombs test pending. LDH is pending. Haptoglobin pending. Reticulocyte count pending. Patient is very insistent on being discharged. Patient improved clinically. Patient will need to followup with PCP as outpatient and possibly hematology referral for further evaluation.  #2 shortness of breath  Likely secondary to problem #1 versus cardiac etiology. EKG does show LVH as well as some ST depression in leads V4 through V6 and slight ST depression in 2,3, aVF. Patient denies any current chest pain. Patient states that short of breath on activity such as gardening. Patient is in agreement to repeat EKG. Patient refusing 2-D echo.  Patient refusing any further cardiac workup. Patient will need to followup as outpatient for cardiac workup.  #3 abnormal EKG  REPEAT EKG this morning with no significant change. First set of troponin was negative x1.  Patient currently chest pain-free. Patient is refusing 2-D echo any further cardiac workup. Patient will need to followup with cardiology as outpatient.  #4 peripheral vascular disease  Plavix.  #5 diabetes mellitus  Will increase Lantus to 22 units daily and continue sliding scale insulin.  #6 hypertension  Stable. Continue current regimen.  #7-gastro-esophageal reflux disease  PPI.      Procedures:  2 units packed red blood cells 05/18/2013 to 05/19/2013  Consultations:  None  Discharge Exam: Filed Vitals:   05/19/13 0445 05/19/13 0600 05/19/13 0743 05/19/13 1024  BP: 125/54 130/58  139/67  Pulse: 72 65    Temp: 98.2 F (36.8 C) 97.5 F (36.4 C)    TempSrc: Oral Oral    Resp: 14 16    Height:      Weight:   70.67 kg (155 lb 12.8 oz)   SpO2:        General: NAD Cardiovascular: RRR Respiratory: CTAB  Discharge Instructions      Discharge Orders   Future Appointments Provider Department Dept Phone   06/08/2013 11:50 AM Beatrice Lecher, PA-C  [facility] [place]) [phone]   07/05/2013 8:45 AM Mary-Margaret Daphine Deutscher, FNP [facility] [phone]   Future Orders Complete By Expires     Diet - low sodium heart healthy  As directed     Comments:      Diabetic diet.    Discharge instructions  As directed     Comments:      Follow up with [name], MD in 1 week. Follow up with Tereso Newcomer of [facility] cardiology 06/08/13 at 1150AM.    Increase activity slowly  As directed         Medication List    STOP taking these medications       GOODYS BODY PAIN PO      TAKE these medications       cholecalciferol 1000 UNITS tablet  Commonly known as:  VITAMIN D  Take 1,000 Units by mouth daily.     clopidogrel 75 MG tablet  Commonly known as:  PLAVIX  Take 75 mg by mouth daily.     fluticasone 50 MCG/ACT nasal spray  Commonly known as:  FLONASE  Place 2 sprays into the nose as needed for allergies.     insulin  aspart 100 UNIT/ML injection  Commonly known as:  novoLOG  Inject 0-18 Units into the skin 3 (three) times daily before meals. Per patient's sliding scale     insulin glargine 100 UNIT/ML injection  Commonly known as:  LANTUS  Inject 0.22 mLs (22 Units total) into the skin daily. Inject up to 20 units qd ad     Iron 325 (65 FE) MG Tabs  Take 1 tablet by mouth 3 (three) times daily.     lisinopril 2.5 MG tablet  Commonly known as:  PRINIVIL,ZESTRIL  Take 2.5 mg by mouth daily.     omeprazole 40 MG capsule  Commonly known as:  PRILOSEC  Take 40 mg by mouth daily.     ONE TOUCH ULTRA TEST test strip  Generic drug:  glucose blood  1 each by Other route 3 (three) times daily.     oxyCODONE-acetaminophen 10-325 MG per tablet  Commonly known as:  PERCOCET  Take 1 tablet by mouth every 6 (six) hours as needed for pain.     RELION INSULIN SYR 0.5ML/31G 31G X 5/16" 0.5 ML Misc  Generic drug:  Insulin Syringe-Needle U-100  Inject 1 Syringe into the skin 3 (three) times daily.     tadalafil 10 MG tablet  Commonly known as:  CIALIS  Take 10 mg by mouth daily as needed for erectile dysfunction.     Testosterone 40.5 MG/2.5GM (1.62%) Gel  Commonly known as:  ANDROGEL  Place 2 Squirts onto the skin daily.     vitamin C 500 MG tablet  Commonly known as:  ASCORBIC ACID  Take 500 mg by mouth daily.       No Known Allergies Follow-up Information   Follow up with Tereso Newcomer, PA-C On 06/08/2013. (f/u at 1150am for EKG changes and further workup of SOB with activity)    Contact information:   [address] [place] [address] [phone]       Follow up with [name], MD. Schedule an appointment as soon as possible for a visit in 1 week.   Contact information:   [address] [place] [address] [phone]        The results of significant diagnostics from this hospitalization (including imaging, microbiology, ancillary and laboratory) are  listed below for reference.    Significant Diagnostic Studies: No results found.  Microbiology: No results found for this or any previous visit (from the past 240 hour(s)).   Labs: Basic Metabolic Panel:  Recent Labs Lab 05/18/13 1526 05/18/13 1623 05/19/13 0856  NA 134* 138 135  K 4.1 4.4 4.4  CL 101 103 100  CO2 25  --  25  GLUCOSE 260* 261* 327*  BUN 16 16 12   CREATININE 0.93 1.00 0.79  CALCIUM 8.7  --  8.9   Liver Function Tests: No results found for this basename: AST, ALT, ALKPHOS, BILITOT, PROT, ALBUMIN,  in the last 168 hours No results found for this basename: LIPASE, AMYLASE,  in the last 168 hours No results found for this basename: AMMONIA,  in the last 168 hours CBC:  Recent Labs Lab 05/18/13 1236 05/18/13 1526 05/18/13 1623 05/19/13 0856  WBC 5.0 4.6  --  4.6  HGB 6.7* 6.3* 7.5* 8.4*  HCT 21.2* 21.1* 22.0* 27.4*  MCV 72.8* 75.1*  --  77.4*  PLT  --  351  --  354   Cardiac Enzymes:  Recent Labs Lab 05/19/13 0857  TROPONINI <0.30   BNP: BNP (last 3 results) No results found for this basename: PROBNP,  in the last 8760 hours CBG:  Recent Labs Lab 05/18/13 1633 05/18/13 2124 05/19/13 0622  GLUCAP 199* 215* 338*       Signed:  Elloise Roark  [facility] 05/19/2013, 10:52 AM

## 2013-05-19 NOTE — Progress Notes (Signed)
[facility] PROGRESS NOTE  [name] UEA:[id number] DOB: Mar 27, 1946 DOA: 05/18/2013 PCP: [name], MD  Assessment/Plan: #1 iron deficiency anemia Patient presented with generalized weakness and shortness of breath on exertion. Patient noted to be anemic with a hemoglobin of 6.3 on admission. Anemia panel done was consistent with iron deficiency anemia with an iron level of 10 and a ferritin of 4 and a folate of 15. Patient with prior GI workup in 2012 with EGD 9 2012 showing hiatal hernia but otherwise normal. Colonoscopy of 9 2012 showed 4-5 mm polyp in final pathology otherwise normal. Capsule endoscopy October 2012 showed no evidence of ulceration, inflammation/erosion or any vascular abnormality. FOBT was negative. Patient is status post 2 units of packed red blood cells. Coombs test pending. LD is pending. Haptoglobin pending. Reticulocyte count pending. Patient is very insistent on being discharged. She'll need to followup with PCP as outpatient and possibly hematology oncology.  #2 shortness of breath Likely secondary to problem #1 versus cardiac etiology. EKG does show LVH as well as some ST depression in leads V4 through V6 and slight ST depression in 23 in aVF. Patient denies any current chest pain. Patient states that short of breath on activity such as gardening. Patient is in agreement to repeat EKG. Patient refusing 2-D echo. Patient refusing any further cardiac workup. Patient will need to followup as outpatient for cardiac workup.  #3 abnormal EKG We'll repeat EKG this morning. Will check a set of troponins. Patient is refusing 2-D echo any further cardiac workup. Patient will need to followup with cardiology as outpatient.  #4 peripheral vascular disease Plavix.  #5 diabetes mellitus Lantus and sliding scale insulin.  #6 hypertension Stable. Continue current regimen.  #7-gastro-esophageal reflux disease PPI.  #8 prophylaxis. SCDs for DVT  prophylaxis.  Code Status: Full Family Communication: Updated patient and girlfriend at bedside Disposition Plan: Patient is insistent on being discharged home today   Consultants:  None  Procedures:  2 units packed red blood cells 7/1 TO 05/19/2013  Antibiotics:  None  HPI/Subjective: Patient states she's feeling better. Patient denies any chest pain. Patient does endorse shortness of breath on activity such as gardening. Patient is very insistent and adamant to being discharged home today after receiving his 2 units of packed red blood cells.  Objective: Filed Vitals:   05/19/13 0351 05/19/13 0445 05/19/13 0600 05/19/13 0743  BP: 121/56 125/54 130/58   Pulse: 71 72 65   Temp: 98.6 F (37 C) 98.2 F (36.8 C) 97.5 F (36.4 C)   TempSrc: Oral Oral Oral   Resp: 14 14 16    Height:      Weight:    70.67 kg (155 lb 12.8 oz)  SpO2:        Intake/Output Summary (Last 24 hours) at 05/19/13 0937 Last data filed at 05/19/13 0641  Gross per 24 hour  Intake    733 ml  Output   1675 ml  Net   -942 ml   Filed Weights   05/18/13 1830 05/19/13 0743  Weight: 71.3 kg (157 lb 3 oz) 70.67 kg (155 lb 12.8 oz)    Exam:   General:  NAD  Cardiovascular: RRR  Respiratory: CTAB  Abdomen: Soft/ NT/ND/+BS  Extremities: No clubbing cyanosis or edema  Musculoskeletal: 5/5 BUE strength, 5/ 5 bilateral lower extremity strength  Data Reviewed: Basic Metabolic Panel:  Recent Labs Lab 05/18/13 1526 05/18/13 1623  NA 134* 138  K 4.1 4.4  CL 101 103  CO2  25  --   GLUCOSE 260* 261*  BUN 16 16  CREATININE 0.93 1.00  CALCIUM 8.7  --    Liver Function Tests: No results found for this basename: AST, ALT, ALKPHOS, BILITOT, PROT, ALBUMIN,  in the last 168 hours No results found for this basename: LIPASE, AMYLASE,  in the last 168 hours No results found for this basename: AMMONIA,  in the last 168 hours CBC:  Recent Labs Lab 05/18/13 1236 05/18/13 1526 05/18/13 1623   WBC 5.0 4.6  --   HGB 6.7* 6.3* 7.5*  HCT 21.2* 21.1* 22.0*  MCV 72.8* 75.1*  --   PLT  --  351  --    Cardiac Enzymes: No results found for this basename: CKTOTAL, CKMB, CKMBINDEX, TROPONINI,  in the last 168 hours BNP (last 3 results) No results found for this basename: PROBNP,  in the last 8760 hours CBG:  Recent Labs Lab 05/18/13 1633 05/18/13 2124 05/19/13 0622  GLUCAP 199* 215* 338*    No results found for this or any previous visit (from the past 240 hour(s)).   Studies: No results found.  Scheduled Meds: . cholecalciferol  1,000 Units Oral Daily  . clopidogrel  75 mg Oral Daily  . insulin aspart  0-5 Units Subcutaneous QHS  . insulin aspart  0-9 Units Subcutaneous TID WC  . insulin glargine  20 Units Subcutaneous QHS  . lisinopril  2.5 mg Oral Daily  . pantoprazole  80 mg Oral Daily  . sodium chloride  3 mL Intravenous Q12H  . vitamin C  500 mg Oral Daily   Continuous Infusions:   Principal Problem:   Iron deficiency anemia Active Problems:   PVD (peripheral vascular disease)   DM (diabetes mellitus)   HTN (hypertension)   Peripheral vascular disease, unspecified   GERD (gastroesophageal reflux disease)   SOB (shortness of breath)   Nonspecific abnormal electrocardiogram (ECG) (EKG)    Time spent: > 35 mins    [facility]  [facility] Pager [phone]. If 7PM-7AM, please contact night-coverage at [email or web address], password [facility] 05/19/2013, 9:37 AM  LOS: 1 day

## 2013-05-19 NOTE — Plan of Care (Signed)
Problem: Problem: Diabetes Management Progression Goal: INCREASE DIABETES KNOWLEDGE Outcome: Completed/Met Date Met:  05/19/13 Exit care notes on 1800 cal ADA diet given to pt

## 2013-05-19 NOTE — Telephone Encounter (Signed)
Appt given for Monday with MMM

## 2013-05-20 LAB — TYPE AND SCREEN
ABO/RH(D): O POS
Unit division: 0

## 2013-05-24 ENCOUNTER — Encounter: Payer: Self-pay | Admitting: Nurse Practitioner

## 2013-05-24 ENCOUNTER — Ambulatory Visit (INDEPENDENT_AMBULATORY_CARE_PROVIDER_SITE_OTHER): Payer: Medicare HMO | Admitting: Nurse Practitioner

## 2013-05-24 VITALS — BP 163/64 | HR 81 | Temp 97.2°F | Ht 71.0 in | Wt 166.0 lb

## 2013-05-24 DIAGNOSIS — Z09 Encounter for follow-up examination after completed treatment for conditions other than malignant neoplasm: Secondary | ICD-10-CM

## 2013-05-24 DIAGNOSIS — D649 Anemia, unspecified: Secondary | ICD-10-CM

## 2013-05-24 LAB — POCT CBC
Hemoglobin: 9.4 g/dL — AB (ref 14.1–18.1)
MCH, POC: 25.1 pg — AB (ref 27–31.2)
MCV: 76.9 fL — AB (ref 80–97)
RBC: 3.8 M/uL — AB (ref 4.69–6.13)

## 2013-05-24 NOTE — Patient Instructions (Addendum)
Hemoglobin This is a blood test to measure the total amount of the hemoglobin in your blood. The hemoglobin is the portion of the red blood cells which carries oxygen and carbon dioxide. The hemoglobin will be low in different types of anemia. It will also be low in certain disease states including cancer, and in people with nutritional problems or those that have undergone severe bleeding. PREPARATION FOR TEST No preparation or fasting is necessary.  Inform the person doing the test if you are pregnant  Tell the person doing the test if you are a smoker  Avoid strenuous exercise before having this test NORMAL FINDINGS  Male: 14-18 g/dl or 8.7-11.2 mmol/L (SI units)  Male: 12-16 g/dl or 7.4-9.9 mmol/L (SI units)  Pregnant male: greater than 11 g/dl  Elderly: values are slightly decreased  Children:  Newborn: 14-24 g/dl  0-2 weeks: 12-20 g/dl  2-6 months: 10-17 g/dl  6 months  1 year: 9.5-14 g/dl  1-6 years: 9.5-14 g/dl  6-18 years: 10-15.5 g/dl Ranges for normal findings may vary among different laboratories and hospitals. You should always check with your doctor after having lab work or other tests done to discuss the meaning of your test results and whether your values are considered within normal limits. MEANING OF TEST  Your caregiver will go over the test results with you and discuss the importance and meaning of your results, as well as treatment options and the need for additional tests if necessary. OBTAINING THE TEST RESULTS It is your responsibility to obtain your test results. Ask the lab or department performing the test when and how you will get your results. Document Released: 12/07/2004 Document Revised: 01/27/2012 Document Reviewed: 10/15/2008 Exit[employer] Patient Information 2014 [employer].  

## 2013-05-24 NOTE — Progress Notes (Signed)
  Subjective:    Patient ID: [name], male    DOB: 1946/04/20, 67 y.o.   MRN: [id number]  HPI Pt here for follow up to recheck his Hgb. Pt had two units of PRBC on 7/2. Pt has not complaints and denies any  Fatigue or SOB. * this happens real often with this patient   Review of Systems  All other systems reviewed and are negative.       Objective:   Physical Exam  Constitutional: He is oriented to person, place, and time. He appears well-developed and well-nourished.  HENT:  Head: Normocephalic.  Neck: Normal range of motion. Neck supple. No thyromegaly present.  Cardiovascular: Normal rate, normal heart sounds and intact distal pulses.   Pulmonary/Chest: Breath sounds normal.  Musculoskeletal: Normal range of motion. He exhibits no edema.  Neurological: He is alert and oriented to person, place, and time.  Skin: Skin is warm and dry.  Psychiatric: He has a normal mood and affect. His behavior is normal. Judgment and thought content normal.    BP 163/64  Pulse 81  Temp(Src) 97.2 F (36.2 C) (Oral)  Ht 5\' 11"  (1.803 m)  Wt 166 lb (75.297 kg)  BMI 23.16 kg/m2 Results for orders placed in visit on 05/24/13  POCT CBC      Result Value Range   WBC 6.0  4.6 - 10.2 K/uL   Lymph, poc 1.9  0.6 - 3.4   POC LYMPH PERCENT 32.1  10 - 50 %L   POC Granulocyte 3.8  2 - 6.9   Granulocyte percent 62.8  37 - 80 %G   RBC 3.8 (*) 4.69 - 6.13 M/uL   Hemoglobin 9.4 (*) 14.1 - 18.1 g/dL   HCT, POC 40.9 (*) 81.1 - 53.7 %   MCV 76.9 (*) 80 - 97 fL   MCH, POC 25.1 (*) 27 - 31.2 pg   MCHC 32.6  31.8 - 35.4 g/dL   RDW, POC 91.4     Platelet Count, POC 349.0  142 - 424 K/uL   MPV 7.9  0 - 99.8 fL        Assessment & Plan:  1. Anemia Iron rich diet Iron suplements - POCT CBC  2. Hospital discharge follow-up  Mary-Margaret Daphine Deutscher, FNP

## 2013-05-31 ENCOUNTER — Other Ambulatory Visit (INDEPENDENT_AMBULATORY_CARE_PROVIDER_SITE_OTHER): Payer: Medicare HMO

## 2013-05-31 DIAGNOSIS — R7989 Other specified abnormal findings of blood chemistry: Secondary | ICD-10-CM

## 2013-05-31 LAB — POCT CBC
Granulocyte percent: 71.7 %G (ref 37–80)
MCV: 81.5 fL (ref 80–97)
POC Granulocyte: 3.5 (ref 2–6.9)
POC LYMPH PERCENT: 23.3 %L (ref 10–50)
Platelet Count, POC: 263 10*3/uL (ref 142–424)
RDW, POC: 27.2 %

## 2013-06-08 ENCOUNTER — Encounter: Payer: Medicare HMO | Admitting: Physician Assistant

## 2013-06-08 ENCOUNTER — Other Ambulatory Visit: Payer: Self-pay | Admitting: Nurse Practitioner

## 2013-06-16 ENCOUNTER — Telehealth: Payer: Self-pay | Admitting: Nurse Practitioner

## 2013-06-16 DIAGNOSIS — M25551 Pain in right hip: Secondary | ICD-10-CM

## 2013-06-17 MED ORDER — OXYCODONE-ACETAMINOPHEN 10-325 MG PO TABS: 1.0000 | ORAL_TABLET | Freq: Four times a day (QID) | ORAL | Status: DC | PRN

## 2013-06-17 NOTE — Telephone Encounter (Signed)
rx ready for pickup 

## 2013-06-17 NOTE — Telephone Encounter (Signed)
Left detailed message that rx is ready to be picked up

## 2013-06-28 ENCOUNTER — Other Ambulatory Visit (HOSPITAL_COMMUNITY): Payer: Self-pay | Admitting: Nurse Practitioner

## 2013-07-05 ENCOUNTER — Encounter: Payer: Self-pay | Admitting: Nurse Practitioner

## 2013-07-05 ENCOUNTER — Ambulatory Visit (INDEPENDENT_AMBULATORY_CARE_PROVIDER_SITE_OTHER): Payer: Medicare HMO | Admitting: Nurse Practitioner

## 2013-07-05 VITALS — BP 137/61 | HR 78 | Temp 97.6°F | Ht 71.0 in | Wt 161.0 lb

## 2013-07-05 DIAGNOSIS — E119 Type 2 diabetes mellitus without complications: Secondary | ICD-10-CM

## 2013-07-05 DIAGNOSIS — E291 Testicular hypofunction: Secondary | ICD-10-CM

## 2013-07-05 DIAGNOSIS — N529 Male erectile dysfunction, unspecified: Secondary | ICD-10-CM

## 2013-07-05 DIAGNOSIS — D500 Iron deficiency anemia secondary to blood loss (chronic): Secondary | ICD-10-CM

## 2013-07-05 DIAGNOSIS — R7989 Other specified abnormal findings of blood chemistry: Secondary | ICD-10-CM

## 2013-07-05 DIAGNOSIS — E785 Hyperlipidemia, unspecified: Secondary | ICD-10-CM

## 2013-07-05 DIAGNOSIS — I10 Essential (primary) hypertension: Secondary | ICD-10-CM

## 2013-07-05 DIAGNOSIS — K219 Gastro-esophageal reflux disease without esophagitis: Secondary | ICD-10-CM

## 2013-07-05 LAB — POCT UA - MICROALBUMIN: Microalbumin Ur, POC: NEGATIVE mg/L

## 2013-07-05 LAB — POCT HEMOGLOBIN: Hemoglobin: 11.9 g/dL — AB (ref 14.1–18.1)

## 2013-07-05 LAB — POCT GLYCOSYLATED HEMOGLOBIN (HGB A1C): Hemoglobin A1C: 5.9

## 2013-07-05 MED ORDER — SILDENAFIL CITRATE 100 MG PO TABS: 100.0000 mg | ORAL_TABLET | Freq: Every day | ORAL | Status: DC | PRN

## 2013-07-05 MED ORDER — SILDENAFIL CITRATE 100 MG PO TABS: 50.0000 mg | ORAL_TABLET | Freq: Every day | ORAL | Status: DC | PRN

## 2013-07-05 NOTE — Progress Notes (Signed)
Subjective:    Patient ID: [name], male    DOB: September 14, 1946, 67 y.o.   MRN: [id number]  Diabetes He presents for his follow-up diabetic visit. He has type 2 diabetes mellitus. No MedicAlert identification noted. The initial diagnosis of diabetes was made 30 years ago. His disease course has been stable. There are no hypoglycemic associated symptoms. Pertinent negatives for hypoglycemia include no confusion or pallor. Pertinent negatives for diabetes include no chest pain, no foot paresthesias, no foot ulcerations, no polydipsia, no polyphagia, no polyuria, no visual change, no weakness and no weight loss. There are no hypoglycemic complications. Symptoms are stable. Diabetic complications include heart disease, impotence and peripheral neuropathy (mild). Risk factors for coronary artery disease include male sex, dyslipidemia and hypertension. Current diabetic treatment includes insulin injections. He is compliant with treatment all of the time. Insulin dose schedule: lantus in AM, and sliding scale novolog at meals. Rotation sites for injection include the abdominal wall. His weight is stable. When asked about meal planning, he reported none. He has not had a previous visit with a dietician. He rarely participates in exercise. His home blood glucose trend is fluctuating dramatically. His breakfast blood glucose is taken between 7-8 am. His breakfast blood glucose range is generally 90-110 mg/dl. His lunch blood glucose is taken between 1-2 pm. His lunch blood glucose range is generally 130-140 mg/dl. His dinner blood glucose is taken between 6-7 pm. His dinner blood glucose range is generally 110-130 mg/dl. His highest blood glucose is >200 mg/dl. His overall blood glucose range is 110-130 mg/dl. (Blood sugar range from low 100's to low 400's) An ACE inhibitor/angiotensin II receptor blocker is being taken. He does not see a podiatrist.Eye exam is current (06/2012).  Hypertension This is a chronic  problem. The current episode started more than 1 year ago. The problem is unchanged. The problem is controlled. Pertinent negatives include no chest pain, neck pain, orthopnea, palpitations, peripheral edema or shortness of breath. There are no associated agents to hypertension. Risk factors for coronary artery disease include diabetes mellitus, dyslipidemia and male gender. Past treatments include ACE inhibitors. The current treatment provides significant improvement. Compliance problems include diet and exercise.   Anemia Presents for follow-up visit. There has been no abdominal pain, confusion, leg swelling, light-headedness, pallor, palpitations or weight loss. Procedure history includes EGD (years ago). Compliance problems include adherence to diet.  Compliance with medications is 76-100%. Compliance with diet is 26-50%. Side effects of medications include auditory problems.  Gastrophageal Reflux He reports no abdominal pain, no chest pain, no coughing, no dysphagia, no heartburn or no sore throat. This is a chronic problem. The current episode started more than 1 year ago. The problem occurs rarely. The problem has been rapidly improving. Nothing aggravates the symptoms. Associated symptoms include anemia. Pertinent negatives include no weight loss. Risk factors include ETOH use. He has tried a PPI for the symptoms. The treatment provided moderate relief. Past procedures include an EGD (years ago).  Hyperlipidemia This is a chronic problem. The current episode started more than 1 year ago. The problem is controlled. Recent lipid tests were reviewed and are low. Exacerbating diseases include diabetes. Pertinent negatives include no chest pain or shortness of breath. He is currently on no antihyperlipidemic treatment. The current treatment provides significant improvement of lipids. There are no compliance problems.  Risk factors for coronary artery disease include diabetes mellitus, hypertension and male  sex.  PVD Plavix dialy- no bleeding that he is aware of .  No longer sees PV surgeon    Review of Systems  Constitutional: Negative for weight loss.  HENT: Negative for sore throat and neck pain.   Respiratory: Negative for cough and shortness of breath.   Cardiovascular: Negative for chest pain, palpitations and orthopnea.  Gastrointestinal: Negative for heartburn, dysphagia and abdominal pain.  Endocrine: Negative for polydipsia, polyphagia and polyuria.  Genitourinary: Positive for impotence.  Skin: Negative for pallor.  Neurological: Negative for weakness and light-headedness.  Psychiatric/Behavioral: Negative for confusion.  All other systems reviewed and are negative.       Objective:   Physical Exam  Constitutional: He is oriented to person, place, and time. He appears well-developed and well-nourished.  HENT:  Head: Normocephalic.  Right Ear: External ear normal.  Left Ear: External ear normal.  Nose: Nose normal.  Mouth/Throat: Oropharynx is clear and moist.  Eyes: EOM are normal. Pupils are equal, round, and reactive to light.  Neck: Normal range of motion. Neck supple. No thyromegaly present.  Cardiovascular: Normal rate, regular rhythm, normal heart sounds and intact distal pulses.   No murmur heard. Pulmonary/Chest: Effort normal and breath sounds normal. He has no wheezes. He has no rales.  Abdominal: Soft. Bowel sounds are normal.  Genitourinary: Prostate normal and penis normal.  Musculoskeletal: Normal range of motion.  Neurological: He is alert and oriented to person, place, and time.  Positive 3/4 monofilament bil feet- unable to feel bil great toe.  Skin: Skin is warm and dry.  Slight callus formation bil heels  Psychiatric: He has a normal mood and affect. His behavior is normal. Judgment and thought content normal.   BP 137/61  Pulse 78  Temp(Src) 97.6 F (36.4 C) (Oral)  Ht 5\' 11"  (1.803 m)  Wt 161 lb (73.029 kg)  BMI 22.46 kg/m2 Results for  orders placed in visit on 07/05/13  POCT GLYCOSYLATED HEMOGLOBIN (HGB A1C)      Result Value Range   Hemoglobin A1C 5.9    POCT HEMOGLOBIN      Result Value Range   Hemoglobin 11.9 (*) 14.1 - 18.1 g/dL         Assessment & Plan:  1. Diabetes Continue low carb diet - POCT glycosylated hemoglobin (Hb A1C) - NMR Lipoprofile with Lipids - COMPLETE METABOLIC PANEL WITH GFR  2. Anemia Continue iron tablets as Rx - POCT CBC  3. HTN (hypertension) Low NA+ diet and exercise - NMR Lipoprofile with Lipids - COMPLETE METABOLIC PANEL WITH GFR   4. Other and unspecified hyperlipidemia Low fat diet and exercise - POCT glycosylated hemoglobin (Hb A1C) - NMR Lipoprofile with Lipids - COMPLETE METABOLIC PANEL WITH GFR - CBC with Differential - Thyroid Panel With TSH  5. Hypogonadism male  Need to get back on testosterone - Testosterone (ANDROGEL) 40.5 MG/2.5GM (1.62%) GEL; Place 2 Squirts onto the skin daily.  Dispense: 2.5 g; Refill: 5  Continue all meds Meds ordered this encounter  Medications  . sildenafil (VIAGRA) 100 MG tablet    Sig: Take 1 tablet (100 mg total) by mouth daily as needed for erectile dysfunction.    Dispense:  3 tablet    Refill:  0    Order Specific Question:  Supervising Provider    Answer:  Ernestina Penna [[id number]]  . sildenafil (VIAGRA) 100 MG tablet    Sig: Take 0.5-1 tablets (50-100 mg total) by mouth daily as needed for erectile dysfunction.    Dispense:  9 tablet    Refill:  11  Order Specific Question:  Supervising Provider    Answer:  Ernestina Penna [[id number]]     Mary-Margaret Daphine Deutscher, FNP

## 2013-07-05 NOTE — Patient Instructions (Signed)
Health Maintenance, Males A healthy lifestyle and preventative care can promote health and wellness.  Maintain regular health, dental, and eye exams.  Eat a healthy diet. Foods like vegetables, fruits, whole grains, low-fat dairy products, and lean protein foods contain the nutrients you need without too many calories. Decrease your intake of foods high in solid fats, added sugars, and salt. Get information about a proper diet from your caregiver, if necessary.  Regular physical exercise is one of the most important things you can do for your health. Most adults should get at least 150 minutes of moderate-intensity exercise (any activity that increases your heart rate and causes you to sweat) each week. In addition, most adults need muscle-strengthening exercises on 2 or more days a week.   Maintain a healthy weight. The body mass index (BMI) is a screening tool to identify possible weight problems. It provides an estimate of body fat based on height and weight. Your caregiver can help determine your BMI, and can help you achieve or maintain a healthy weight. For adults 20 years and older:  A BMI below 18.5 is considered underweight.  A BMI of 18.5 to 24.9 is normal.  A BMI of 25 to 29.9 is considered overweight.  A BMI of 30 and above is considered obese.  Maintain normal blood lipids and cholesterol by exercising and minimizing your intake of saturated fat. Eat a balanced diet with plenty of fruits and vegetables. Blood tests for lipids and cholesterol should begin at age 20 and be repeated every 5 years. If your lipid or cholesterol levels are high, you are over 50, or you are a high risk for heart disease, you may need your cholesterol levels checked more frequently.Ongoing high lipid and cholesterol levels should be treated with medicines, if diet and exercise are not effective.  If you smoke, find out from your caregiver how to quit. If you do not use tobacco, do not start.  If you  choose to drink alcohol, do not exceed 2 drinks per day. One drink is considered to be 12 ounces (355 mL) of beer, 5 ounces (148 mL) of wine, or 1.5 ounces (44 mL) of liquor.  Avoid use of street drugs. Do not share needles with anyone. Ask for help if you need support or instructions about stopping the use of drugs.  High blood pressure causes heart disease and increases the risk of stroke. Blood pressure should be checked at least every 1 to 2 years. Ongoing high blood pressure should be treated with medicines if weight loss and exercise are not effective.  If you are 45 to 68 years old, ask your caregiver if you should take aspirin to prevent heart disease.  Diabetes screening involves taking a blood sample to check your fasting blood sugar level. This should be done once every 3 years, after age 45, if you are within normal weight and without risk factors for diabetes. Testing should be considered at a younger age or be carried out more frequently if you are overweight and have at least 1 risk factor for diabetes.  Colorectal cancer can be detected and often prevented. Most routine colorectal cancer screening begins at the age of 50 and continues through age 75. However, your caregiver may recommend screening at an earlier age if you have risk factors for colon cancer. On a yearly basis, your caregiver may provide home test kits to check for hidden blood in the stool. Use of a small camera at the end of a tube,   to directly examine the colon (sigmoidoscopy or colonoscopy), can detect the earliest forms of colorectal cancer. Talk to your caregiver about this at age 50, when routine screening begins. Direct examination of the colon should be repeated every 5 to 10 years through age 75, unless early forms of pre-cancerous polyps or small growths are found.  Hepatitis C blood testing is recommended for all people born from 1945 through 1965 and any individual with known risks for hepatitis C.  Healthy  men should no longer receive prostate-specific antigen (PSA) blood tests as part of routine cancer screening. Consult with your caregiver about prostate cancer screening.  Testicular cancer screening is not recommended for adolescents or adult males who have no symptoms. Screening includes self-exam, caregiver exam, and other screening tests. Consult with your caregiver about any symptoms you have or any concerns you have about testicular cancer.  Practice safe sex. Use condoms and avoid high-risk sexual practices to reduce the spread of sexually transmitted infections (STIs).  Use sunscreen with a sun protection factor (SPF) of 30 or greater. Apply sunscreen liberally and repeatedly throughout the day. You should seek shade when your shadow is shorter than you. Protect yourself by wearing long sleeves, pants, a wide-brimmed hat, and sunglasses year round, whenever you are outdoors.  Notify your caregiver of new moles or changes in moles, especially if there is a change in shape or color. Also notify your caregiver if a mole is larger than the size of a pencil eraser.  A one-time screening for abdominal aortic aneurysm (AAA) and surgical repair of large AAAs by sound wave imaging (ultrasonography) is recommended for ages 65 to 75 years who are current or former smokers.  Stay current with your immunizations. Document Released: 05/02/2008 Document Revised: 01/27/2012 Document Reviewed: 04/01/2011 [employer] Patient Information 2014 [employer].  

## 2013-07-06 LAB — NMR, LIPOPROFILE
LDL Particle Number: 871 nmol/L (ref <1000)
LDL Size: 20.9 nm (ref >20.5)
LP-IR Score: 37 (ref <=45)
Triglycerides by NMR: 89 mg/dL (ref <150)

## 2013-07-06 LAB — CMP14+EGFR
ALT: 15 IU/L (ref 0–44)
AST: 15 IU/L (ref 0–40)
Chloride: 100 mmol/L (ref 97–108)
GFR calc Af Amer: 103 mL/min/{1.73_m2} (ref >59)
Glucose: 150 mg/dL — ABNORMAL HIGH (ref 65–99)
Potassium: 4.6 mmol/L (ref 3.5–5.2)
Sodium: 138 mmol/L (ref 134–144)
Total Bilirubin: 0.2 mg/dL (ref 0.0–1.2)
Total Protein: 6 g/dL (ref 6.0–8.5)

## 2013-07-14 ENCOUNTER — Other Ambulatory Visit: Payer: Self-pay | Admitting: Nurse Practitioner

## 2013-07-14 DIAGNOSIS — M25551 Pain in right hip: Secondary | ICD-10-CM

## 2013-07-15 ENCOUNTER — Telehealth: Payer: Self-pay | Admitting: Nurse Practitioner

## 2013-07-15 MED ORDER — OXYCODONE-ACETAMINOPHEN 10-325 MG PO TABS: 1.0000 | ORAL_TABLET | Freq: Four times a day (QID) | ORAL | Status: DC | PRN

## 2013-07-15 NOTE — Telephone Encounter (Signed)
Last filled 06/17/13, last seen 07/05/13. Rx will print call pt to pickup

## 2013-07-15 NOTE — Telephone Encounter (Signed)
rx ready for pickup 

## 2013-07-16 NOTE — Telephone Encounter (Signed)
Up front 

## 2013-08-11 ENCOUNTER — Telehealth: Payer: Self-pay | Admitting: Nurse Practitioner

## 2013-08-13 ENCOUNTER — Other Ambulatory Visit: Payer: Self-pay

## 2013-08-13 DIAGNOSIS — M25551 Pain in right hip: Secondary | ICD-10-CM

## 2013-08-13 MED ORDER — OXYCODONE-ACETAMINOPHEN 10-325 MG PO TABS: 1.0000 | ORAL_TABLET | Freq: Four times a day (QID) | ORAL | Status: DC | PRN

## 2013-08-13 NOTE — Telephone Encounter (Signed)
Last seen 07/05/13  MMM If approved print and route to nurse

## 2013-08-13 NOTE — Telephone Encounter (Signed)
rx ready for pickup 

## 2013-08-13 NOTE — Telephone Encounter (Signed)
Up front 

## 2013-09-06 ENCOUNTER — Other Ambulatory Visit: Payer: Self-pay | Admitting: Family Medicine

## 2013-09-10 ENCOUNTER — Telehealth: Payer: Self-pay | Admitting: Nurse Practitioner

## 2013-09-10 DIAGNOSIS — M25551 Pain in right hip: Secondary | ICD-10-CM

## 2013-09-10 MED ORDER — OXYCODONE-ACETAMINOPHEN 10-325 MG PO TABS: 1.0000 | ORAL_TABLET | Freq: Four times a day (QID) | ORAL | Status: DC | PRN

## 2013-09-10 NOTE — Telephone Encounter (Signed)
Patent informed rx ready for pick up

## 2013-09-23 ENCOUNTER — Other Ambulatory Visit: Payer: Self-pay

## 2013-10-06 ENCOUNTER — Ambulatory Visit (INDEPENDENT_AMBULATORY_CARE_PROVIDER_SITE_OTHER): Payer: Medicare HMO | Admitting: Nurse Practitioner

## 2013-10-06 ENCOUNTER — Encounter: Payer: Self-pay | Admitting: Nurse Practitioner

## 2013-10-06 VITALS — BP 169/78 | HR 74 | Temp 97.1°F | Ht 71.0 in | Wt 170.0 lb

## 2013-10-06 DIAGNOSIS — I509 Heart failure, unspecified: Secondary | ICD-10-CM

## 2013-10-06 DIAGNOSIS — I10 Essential (primary) hypertension: Secondary | ICD-10-CM

## 2013-10-06 DIAGNOSIS — R7989 Other specified abnormal findings of blood chemistry: Secondary | ICD-10-CM

## 2013-10-06 DIAGNOSIS — E119 Type 2 diabetes mellitus without complications: Secondary | ICD-10-CM

## 2013-10-06 DIAGNOSIS — Z23 Encounter for immunization: Secondary | ICD-10-CM

## 2013-10-06 DIAGNOSIS — N5201 Erectile dysfunction due to arterial insufficiency: Secondary | ICD-10-CM

## 2013-10-06 DIAGNOSIS — N529 Male erectile dysfunction, unspecified: Secondary | ICD-10-CM

## 2013-10-06 DIAGNOSIS — E785 Hyperlipidemia, unspecified: Secondary | ICD-10-CM

## 2013-10-06 DIAGNOSIS — D509 Iron deficiency anemia, unspecified: Secondary | ICD-10-CM

## 2013-10-06 DIAGNOSIS — K219 Gastro-esophageal reflux disease without esophagitis: Secondary | ICD-10-CM

## 2013-10-06 DIAGNOSIS — E291 Testicular hypofunction: Secondary | ICD-10-CM

## 2013-10-06 LAB — POCT CBC
Hemoglobin: 11.2 g/dL — AB (ref 14.1–18.1)
Lymph, poc: 1.5 (ref 0.6–3.4)
MCH, POC: 31 pg (ref 27–31.2)
MCHC: 32.4 g/dL (ref 31.8–35.4)
MCV: 95.8 fL (ref 80–97)
POC LYMPH PERCENT: 28.3 %L (ref 10–50)
RBC: 3.6 M/uL — AB (ref 4.69–6.13)
RDW, POC: 16.9 %

## 2013-10-06 MED ORDER — CLOPIDOGREL BISULFATE 75 MG PO TABS: 75.0000 mg | ORAL_TABLET | Freq: Once | ORAL | Status: DC

## 2013-10-06 MED ORDER — FLUTICASONE PROPIONATE 50 MCG/ACT NA SUSP: 2.0000 | Freq: Every day | NASAL | Status: DC

## 2013-10-06 NOTE — Progress Notes (Signed)
Subjective:    Patient ID: [name], male    DOB: [birth year], 67 y.o.   MRN: [id number]  Diabetes He presents for his follow-up diabetic visit. He has type 2 diabetes mellitus. No MedicAlert identification noted. The initial diagnosis of diabetes was made 30 years ago. His disease course has been stable. There are no hypoglycemic associated symptoms. Pertinent negatives for hypoglycemia include no confusion or pallor. Pertinent negatives for diabetes include no chest pain, no foot paresthesias, no foot ulcerations, no polydipsia, no polyphagia, no polyuria, no visual change, no weakness and no weight loss. There are no hypoglycemic complications. Symptoms are stable. Diabetic complications include heart disease, impotence and peripheral neuropathy (mild). Risk factors for coronary artery disease include male sex, dyslipidemia and hypertension. Current diabetic treatment includes insulin injections. He is compliant with treatment all of the time. Insulin dose schedule: lantus in AM, and sliding scale novolog at meals. Rotation sites for injection include the abdominal wall. His weight is stable. When asked about meal planning, he reported none. He has not had a previous visit with a dietician. He rarely participates in exercise. His home blood glucose trend is fluctuating dramatically. (Blood sugar range from low 100's to low 400's) An ACE inhibitor/angiotensin II receptor blocker is being taken. He does not see a podiatrist.Eye exam is current (06/2012).  Hypertension This is a chronic problem. The current episode started more than 1 year ago. The problem is unchanged. The problem is controlled. Pertinent negatives include no chest pain, neck pain, orthopnea, palpitations, peripheral edema or shortness of breath. There are no associated agents to hypertension. Risk factors for coronary artery disease include diabetes mellitus, dyslipidemia and male gender. Past treatments include ACE inhibitors. The  current treatment provides significant improvement. Compliance problems include diet and exercise.   Anemia Presents for follow-up (Last hgb was .13) visit. There has been no abdominal pain, confusion, leg swelling, light-headedness, pallor, palpitations or weight loss. Procedure history includes EGD (years ago). Compliance problems include adherence to diet.  Compliance with medications is 76-100%. Compliance with diet is 26-50%. Side effects of medications include auditory problems.  Gastrophageal Reflux He reports no abdominal pain, no chest pain, no coughing, no dysphagia, no heartburn or no sore throat. This is a chronic problem. The current episode started more than 1 year ago. The problem occurs rarely. The problem has been rapidly improving. Nothing aggravates the symptoms. Associated symptoms include anemia. Pertinent negatives include no weight loss. Risk factors include ETOH use. He has tried a PPI for the symptoms. The treatment provided moderate relief. Past procedures include an EGD (years ago).  Hyperlipidemia This is a chronic problem. The current episode started more than 1 year ago. The problem is controlled. Recent lipid tests were reviewed and are low. Exacerbating diseases include diabetes. Pertinent negatives include no chest pain or shortness of breath. He is currently on no antihyperlipidemic treatment. The current treatment provides significant improvement of lipids. There are no compliance problems.  Risk factors for coronary artery disease include diabetes mellitus, hypertension and male sex.  PVD Plavix dialy- no bleeding that he is aware of . No longer sees [facility] surgeon    Review of Systems  Constitutional: Negative for weight loss.  HENT: Negative for sore throat.   Respiratory: Negative for cough and shortness of breath.   Cardiovascular: Negative for chest pain, palpitations and orthopnea.  Gastrointestinal: Negative for heartburn, dysphagia and abdominal pain.   Endocrine: Negative for polydipsia, polyphagia and polyuria.  Genitourinary: Positive for impotence.  Musculoskeletal: Negative for neck pain.  Skin: Negative for pallor.  Neurological: Negative for weakness and light-headedness.  Psychiatric/Behavioral: Negative for confusion.  All other systems reviewed and are negative.       Objective:   Physical Exam  Constitutional: He is oriented to person, place, and time. He appears well-developed and well-nourished.  HENT:  Head: Normocephalic.  Right Ear: External ear normal.  Left Ear: External ear normal.  Nose: Nose normal.  Mouth/Throat: Oropharynx is clear and moist.  Eyes: EOM are normal. Pupils are equal, round, and reactive to light.  Neck: Normal range of motion. Neck supple. No thyromegaly present.  Cardiovascular: Normal rate, regular rhythm, normal heart sounds and intact distal pulses.   No murmur heard. Pulmonary/Chest: Effort normal and breath sounds normal. He has no wheezes. He has no rales.  Abdominal: Soft. Bowel sounds are normal.  Genitourinary: Prostate normal and penis normal.  Musculoskeletal: Normal range of motion.  Neurological: He is alert and oriented to person, place, and time.  Positive 3/4 monofilament bil feet- unable to feel bil great toe.  Skin: Skin is warm and dry.  Slight callus formation bil heels  Psychiatric: He has a normal mood and affect. His behavior is normal. Judgment and thought content normal.   BP 169/78  Pulse 74  Temp(Src) 97.1 F (36.2 C) (Oral)  Ht 5\' 11"  (1.803 m)  Wt 170 lb (77.111 kg)  BMI 23.72 kg/m2 Results for orders placed in visit on 10/06/13  POCT GLYCOSYLATED HEMOGLOBIN (HGB A1C)      Result Value Range   Hemoglobin A1C 6.2%           Assessment & Plan:   1. DM (diabetes mellitus)   2. HTN (hypertension)   3. Hyperlipidemia LDL goal < 70   4. Need for prophylactic vaccination and inoculation against influenza   5. CHF (congestive heart failure)   6.  Erectile dysfunction due to arterial insufficiency   7. GERD (gastroesophageal reflux disease)   8. Iron deficiency anemia   9. Low testosterone    Orders Placed This Encounter  Procedures  . CMP14+EGFR  . NMR, lipoprofile  . POCT glycosylated hemoglobin (Hb A1C)  . POCT CBC   Meds ordered this encounter  Medications  . fluticasone (FLONASE) 50 MCG/ACT nasal spray    Sig: Place 2 sprays into both nostrils daily.    Dispense:  16 g    Refill:  5    Order Specific Question:  Supervising Provider    Answer:  Ernestina Penna [[id number]]  . clopidogrel (PLAVIX) 75 MG tablet    Sig: Take 1 tablet (75 mg total) by mouth once.    Dispense:  90 tablet    Refill:  1    Order Specific Question:  Supervising Provider    Answer:  Deborra Medina    Continue all meds Labs pending Diet and exercise encouraged Health maintenance reviewed Follow up in 3 months hemocult cards given to patient Flu shot today  Mary-Margaret Daphine Deutscher, FNP

## 2013-10-06 NOTE — Patient Instructions (Signed)

## 2013-10-08 LAB — NMR, LIPOPROFILE
LDL Particle Number: 575 nmol/L (ref <1000)
LDL Size: 21.3 nm (ref >20.5)
LP-IR Score: <25 (ref <=45)
Small LDL Particle Number: <90 nmol/L (ref <=527)

## 2013-10-08 LAB — CMP14+EGFR
ALT: 11 IU/L (ref 0–44)
Albumin/Globulin Ratio: 2.4 (ref 1.1–2.5)
Alkaline Phosphatase: 74 IU/L (ref 39–117)
BUN/Creatinine Ratio: 15 (ref 10–22)
Chloride: 101 mmol/L (ref 97–108)
GFR calc Af Amer: 102 mL/min/{1.73_m2} (ref >59)
GFR calc non Af Amer: 88 mL/min/{1.73_m2} (ref >59)
Potassium: 4.4 mmol/L (ref 3.5–5.2)
Sodium: 138 mmol/L (ref 134–144)
Total Bilirubin: 0.3 mg/dL (ref 0.0–1.2)

## 2013-10-11 ENCOUNTER — Telehealth: Payer: Self-pay | Admitting: Nurse Practitioner

## 2013-10-11 DIAGNOSIS — M25551 Pain in right hip: Secondary | ICD-10-CM

## 2013-10-11 MED ORDER — OXYCODONE-ACETAMINOPHEN 10-325 MG PO TABS: 1.0000 | ORAL_TABLET | Freq: Four times a day (QID) | ORAL | Status: DC | PRN

## 2013-10-11 NOTE — Telephone Encounter (Signed)
Percocet rx ready for pick up.

## 2013-10-11 NOTE — Telephone Encounter (Signed)
AWARE,RX READY.

## 2013-10-21 ENCOUNTER — Other Ambulatory Visit: Payer: Self-pay | Admitting: *Deleted

## 2013-10-21 MED ORDER — OMEPRAZOLE 40 MG PO CPDR: 40.0000 mg | DELAYED_RELEASE_CAPSULE | Freq: Every day | ORAL | Status: DC

## 2013-11-08 ENCOUNTER — Telehealth: Payer: Self-pay | Admitting: Nurse Practitioner

## 2013-11-08 DIAGNOSIS — M25551 Pain in right hip: Secondary | ICD-10-CM

## 2013-11-08 MED ORDER — OXYCODONE-ACETAMINOPHEN 10-325 MG PO TABS: 1.0000 | ORAL_TABLET | Freq: Four times a day (QID) | ORAL | Status: DC | PRN

## 2013-11-08 NOTE — Telephone Encounter (Signed)
rx ready for pickup 

## 2013-11-08 NOTE — Telephone Encounter (Signed)
Patient aware it is ready to pick up

## 2013-11-29 ENCOUNTER — Telehealth: Payer: Self-pay | Admitting: Nurse Practitioner

## 2013-11-29 MED ORDER — FUROSEMIDE 20 MG PO TABS: 20.0000 mg | ORAL_TABLET | Freq: Every day | ORAL | Status: DC

## 2013-11-29 NOTE — Telephone Encounter (Signed)
Lasix sent to pharmacy

## 2013-12-07 ENCOUNTER — Other Ambulatory Visit: Payer: Self-pay | Admitting: Family Medicine

## 2013-12-07 DIAGNOSIS — E119 Type 2 diabetes mellitus without complications: Secondary | ICD-10-CM

## 2013-12-09 ENCOUNTER — Telehealth: Payer: Self-pay | Admitting: Nurse Practitioner

## 2013-12-09 DIAGNOSIS — M25552 Pain in left hip: Principal | ICD-10-CM

## 2013-12-09 DIAGNOSIS — M25551 Pain in right hip: Secondary | ICD-10-CM

## 2013-12-09 MED ORDER — OXYCODONE-ACETAMINOPHEN 10-325 MG PO TABS: 1.0000 | ORAL_TABLET | Freq: Four times a day (QID) | ORAL | Status: DC | PRN

## 2013-12-09 NOTE — Telephone Encounter (Signed)
rx ready for pickup 

## 2013-12-09 NOTE — Telephone Encounter (Signed)
Patient aware.

## 2013-12-28 ENCOUNTER — Other Ambulatory Visit (HOSPITAL_COMMUNITY): Payer: Self-pay | Admitting: Nurse Practitioner

## 2014-01-03 ENCOUNTER — Other Ambulatory Visit: Payer: Self-pay | Admitting: Nurse Practitioner

## 2014-01-05 NOTE — Telephone Encounter (Signed)
Uses [facility]

## 2014-01-06 NOTE — Telephone Encounter (Signed)
Called in.

## 2014-01-06 NOTE — Telephone Encounter (Signed)
Please call in androgel with 1 refills

## 2014-01-07 ENCOUNTER — Encounter: Payer: Self-pay | Admitting: Nurse Practitioner

## 2014-01-07 ENCOUNTER — Ambulatory Visit (INDEPENDENT_AMBULATORY_CARE_PROVIDER_SITE_OTHER): Payer: Medicare HMO | Admitting: Nurse Practitioner

## 2014-01-07 VITALS — BP 149/68 | HR 94 | Temp 96.8°F | Ht 71.0 in | Wt 163.0 lb

## 2014-01-07 DIAGNOSIS — E11319 Type 2 diabetes mellitus with unspecified diabetic retinopathy without macular edema: Secondary | ICD-10-CM

## 2014-01-07 DIAGNOSIS — E785 Hyperlipidemia, unspecified: Secondary | ICD-10-CM

## 2014-01-07 DIAGNOSIS — M25559 Pain in unspecified hip: Secondary | ICD-10-CM

## 2014-01-07 DIAGNOSIS — R7989 Other specified abnormal findings of blood chemistry: Secondary | ICD-10-CM

## 2014-01-07 DIAGNOSIS — I739 Peripheral vascular disease, unspecified: Secondary | ICD-10-CM

## 2014-01-07 DIAGNOSIS — M25552 Pain in left hip: Secondary | ICD-10-CM

## 2014-01-07 DIAGNOSIS — M549 Dorsalgia, unspecified: Secondary | ICD-10-CM

## 2014-01-07 DIAGNOSIS — M25551 Pain in right hip: Secondary | ICD-10-CM

## 2014-01-07 DIAGNOSIS — K219 Gastro-esophageal reflux disease without esophagitis: Secondary | ICD-10-CM

## 2014-01-07 DIAGNOSIS — E291 Testicular hypofunction: Secondary | ICD-10-CM

## 2014-01-07 DIAGNOSIS — E1139 Type 2 diabetes mellitus with other diabetic ophthalmic complication: Secondary | ICD-10-CM

## 2014-01-07 DIAGNOSIS — D509 Iron deficiency anemia, unspecified: Secondary | ICD-10-CM

## 2014-01-07 DIAGNOSIS — I10 Essential (primary) hypertension: Secondary | ICD-10-CM

## 2014-01-07 DIAGNOSIS — I509 Heart failure, unspecified: Secondary | ICD-10-CM

## 2014-01-07 DIAGNOSIS — E119 Type 2 diabetes mellitus without complications: Secondary | ICD-10-CM

## 2014-01-07 DIAGNOSIS — G8929 Other chronic pain: Secondary | ICD-10-CM

## 2014-01-07 LAB — POCT GLYCOSYLATED HEMOGLOBIN (HGB A1C): Hemoglobin A1C: 6.3

## 2014-01-07 MED ORDER — OXYCODONE-ACETAMINOPHEN 10-325 MG PO TABS: 1.0000 | ORAL_TABLET | Freq: Four times a day (QID) | ORAL | Status: DC | PRN

## 2014-01-07 MED ORDER — TESTOSTERONE 40.5 MG/2.5GM (1.62%) TD GEL: 2.0000 | Freq: Every day | TRANSDERMAL | Status: DC

## 2014-01-07 MED ORDER — CLOPIDOGREL BISULFATE 75 MG PO TABS: 75.0000 mg | ORAL_TABLET | Freq: Once | ORAL | Status: DC

## 2014-01-07 MED ORDER — GLUCOSE BLOOD VI STRP: ORAL_STRIP | Status: DC

## 2014-01-07 MED ORDER — IRON 325 (65 FE) MG PO TABS: 1.0000 | ORAL_TABLET | Freq: Three times a day (TID) | ORAL | Status: DC

## 2014-01-07 MED ORDER — OMEPRAZOLE 40 MG PO CPDR: 40.0000 mg | DELAYED_RELEASE_CAPSULE | Freq: Every day | ORAL | Status: DC

## 2014-01-07 MED ORDER — INSULIN GLARGINE 100 UNIT/ML ~~LOC~~ SOLN: 22.0000 [IU] | Freq: Every day | SUBCUTANEOUS | Status: DC

## 2014-01-07 MED ORDER — INSULIN ASPART 100 UNIT/ML ~~LOC~~ SOLN: 0.0000 [IU] | Freq: Three times a day (TID) | SUBCUTANEOUS | Status: DC
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
## 2014-01-10 LAB — CMP14+EGFR
ALBUMIN: 4.3 g/dL (ref 3.6–4.8)
ALK PHOS: 89 IU/L (ref 39–117)
ALT: 9 IU/L (ref 0–44)
AST: 14 IU/L (ref 0–40)
Albumin/Globulin Ratio: 2.3 (ref 1.1–2.5)
BILIRUBIN TOTAL: 0.3 mg/dL (ref 0.0–1.2)
BUN / CREAT RATIO: 13 (ref 10–22)
BUN: 11 mg/dL (ref 8–27)
CO2: 21 mmol/L (ref 18–29)
CREATININE: 0.84 mg/dL (ref 0.76–1.27)
Calcium: 9.3 mg/dL (ref 8.6–10.2)
Chloride: 97 mmol/L (ref 97–108)
GFR calc non Af Amer: 90 mL/min/{1.73_m2} (ref >59)
GFR, EST AFRICAN AMERICAN: 104 mL/min/{1.73_m2} (ref >59)
GLOBULIN, TOTAL: 1.9 g/dL (ref 1.5–4.5)
Glucose: 451 mg/dL (ref 65–99)
Potassium: 4.9 mmol/L (ref 3.5–5.2)
SODIUM: 136 mmol/L (ref 134–144)
Total Protein: 6.2 g/dL (ref 6.0–8.5)

## 2014-01-10 LAB — NMR, LIPOPROFILE
CHOLESTEROL: 153 mg/dL (ref <200)
HDL Cholesterol by NMR: 59 mg/dL (ref >=40)
HDL PARTICLE NUMBER: 33.3 umol/L (ref >=30.5)
LDL Particle Number: 888 nmol/L (ref <1000)
LDL SIZE: 20.6 nm (ref >20.5)
LDLC SERPL CALC-MCNC: 78 mg/dL (ref <100)
LP-IR SCORE: 30 (ref <=45)
SMALL LDL PARTICLE NUMBER: 316 nmol/L (ref <=527)
Triglycerides by NMR: 81 mg/dL (ref <150)

## 2014-01-17 ENCOUNTER — Telehealth: Payer: Self-pay | Admitting: Nurse Practitioner

## 2014-01-25 ENCOUNTER — Encounter: Payer: Self-pay | Admitting: Physical Medicine & Rehabilitation

## 2014-02-02 ENCOUNTER — Telehealth: Payer: Self-pay | Admitting: Nurse Practitioner

## 2014-02-02 ENCOUNTER — Encounter: Payer: Self-pay | Admitting: Nurse Practitioner

## 2014-02-02 DIAGNOSIS — M25552 Pain in left hip: Principal | ICD-10-CM

## 2014-02-02 DIAGNOSIS — M25551 Pain in right hip: Secondary | ICD-10-CM

## 2014-02-02 DIAGNOSIS — G8929 Other chronic pain: Secondary | ICD-10-CM | POA: Insufficient documentation

## 2014-02-02 DIAGNOSIS — M549 Dorsalgia, unspecified: Secondary | ICD-10-CM

## 2014-02-02 MED ORDER — OXYCODONE-ACETAMINOPHEN 10-325 MG PO TABS: 1.0000 | ORAL_TABLET | Freq: Four times a day (QID) | ORAL | Status: DC | PRN

## 2014-02-02 NOTE — Telephone Encounter (Signed)
rx ready for pick up If is going to continue pain meds office wants [place] to start referring people to pain management- have you seen pain management

## 2014-02-02 NOTE — Telephone Encounter (Signed)
Patient picked up.

## 2014-03-03 ENCOUNTER — Telehealth: Payer: Self-pay | Admitting: Nurse Practitioner

## 2014-03-03 DIAGNOSIS — M25552 Pain in left hip: Principal | ICD-10-CM

## 2014-03-03 DIAGNOSIS — M25551 Pain in right hip: Secondary | ICD-10-CM

## 2014-03-03 MED ORDER — OXYCODONE-ACETAMINOPHEN 10-325 MG PO TABS: 1.0000 | ORAL_TABLET | Freq: Four times a day (QID) | ORAL | Status: DC | PRN

## 2014-03-03 NOTE — Telephone Encounter (Signed)
rx ready for pickup 

## 2014-03-03 NOTE — Telephone Encounter (Signed)
Pt notified to pickup rx and verbalized understanding.

## 2014-03-16 ENCOUNTER — Ambulatory Visit (HOSPITAL_COMMUNITY)
Admission: RE | Admit: 2014-03-16 | Discharge: 2014-03-16 | Disposition: A | Discharge status: Home / Self Care | Payer: Medicare HMO | Source: Ambulatory Visit | Attending: Physical Medicine & Rehabilitation | Admitting: Physical Medicine & Rehabilitation

## 2014-03-16 ENCOUNTER — Encounter: Payer: Medicare HMO | Attending: Physical Medicine & Rehabilitation | Admitting: Physical Medicine & Rehabilitation

## 2014-03-16 ENCOUNTER — Encounter: Payer: Self-pay | Admitting: Physical Medicine & Rehabilitation

## 2014-03-16 ENCOUNTER — Other Ambulatory Visit: Payer: Self-pay | Admitting: Physical Medicine & Rehabilitation

## 2014-03-16 VITALS — BP 142/65 | HR 90 | Resp 14 | Ht 71.0 in | Wt 166.0 lb

## 2014-03-16 DIAGNOSIS — M719 Bursopathy, unspecified: Secondary | ICD-10-CM | POA: Insufficient documentation

## 2014-03-16 DIAGNOSIS — M169 Osteoarthritis of hip, unspecified: Secondary | ICD-10-CM

## 2014-03-16 DIAGNOSIS — Z96649 Presence of unspecified artificial hip joint: Secondary | ICD-10-CM | POA: Insufficient documentation

## 2014-03-16 DIAGNOSIS — M549 Dorsalgia, unspecified: Secondary | ICD-10-CM

## 2014-03-16 DIAGNOSIS — G609 Hereditary and idiopathic neuropathy, unspecified: Secondary | ICD-10-CM | POA: Insufficient documentation

## 2014-03-16 DIAGNOSIS — Z794 Long term (current) use of insulin: Secondary | ICD-10-CM | POA: Insufficient documentation

## 2014-03-16 DIAGNOSIS — M19019 Primary osteoarthritis, unspecified shoulder: Secondary | ICD-10-CM | POA: Insufficient documentation

## 2014-03-16 DIAGNOSIS — Z87891 Personal history of nicotine dependence: Secondary | ICD-10-CM | POA: Insufficient documentation

## 2014-03-16 DIAGNOSIS — E119 Type 2 diabetes mellitus without complications: Secondary | ICD-10-CM | POA: Insufficient documentation

## 2014-03-16 DIAGNOSIS — M1610 Unilateral primary osteoarthritis, unspecified hip: Secondary | ICD-10-CM

## 2014-03-16 DIAGNOSIS — I739 Peripheral vascular disease, unspecified: Secondary | ICD-10-CM | POA: Insufficient documentation

## 2014-03-16 DIAGNOSIS — M7582 Other shoulder lesions, left shoulder: Secondary | ICD-10-CM

## 2014-03-16 DIAGNOSIS — I10 Essential (primary) hypertension: Secondary | ICD-10-CM | POA: Insufficient documentation

## 2014-03-16 DIAGNOSIS — M25559 Pain in unspecified hip: Secondary | ICD-10-CM | POA: Insufficient documentation

## 2014-03-16 DIAGNOSIS — Z79899 Other long term (current) drug therapy: Secondary | ICD-10-CM

## 2014-03-16 DIAGNOSIS — M19011 Primary osteoarthritis, right shoulder: Secondary | ICD-10-CM

## 2014-03-16 DIAGNOSIS — M160 Bilateral primary osteoarthritis of hip: Secondary | ICD-10-CM

## 2014-03-16 DIAGNOSIS — M67919 Unspecified disorder of synovium and tendon, unspecified shoulder: Secondary | ICD-10-CM | POA: Insufficient documentation

## 2014-03-16 DIAGNOSIS — Z5181 Encounter for therapeutic drug level monitoring: Secondary | ICD-10-CM

## 2014-03-16 DIAGNOSIS — I509 Heart failure, unspecified: Secondary | ICD-10-CM | POA: Insufficient documentation

## 2014-03-16 DIAGNOSIS — M19012 Primary osteoarthritis, left shoulder: Secondary | ICD-10-CM

## 2014-03-16 DIAGNOSIS — M25519 Pain in unspecified shoulder: Secondary | ICD-10-CM | POA: Insufficient documentation

## 2014-03-16 DIAGNOSIS — K219 Gastro-esophageal reflux disease without esophagitis: Secondary | ICD-10-CM | POA: Insufficient documentation

## 2014-03-16 DIAGNOSIS — M7581 Other shoulder lesions, right shoulder: Secondary | ICD-10-CM | POA: Insufficient documentation

## 2014-03-16 DIAGNOSIS — G8929 Other chronic pain: Secondary | ICD-10-CM | POA: Insufficient documentation

## 2014-03-16 NOTE — Patient Instructions (Signed)
PLEASE CALL ME WITH ANY PROBLEMS OR QUESTIONS (#[phone]).     WE CAN BEGIN PRESCRIBING YOUR PERCOCET ONCE YOUR URINE SPECIMEN COMES BACK TO [place] AND IS CONSISTENT
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
## 2014-03-22 ENCOUNTER — Other Ambulatory Visit: Payer: Self-pay | Admitting: Physical Medicine & Rehabilitation

## 2014-03-22 NOTE — Progress Notes (Signed)
Urine drug screen from 03/16/2014 was consistent.

## 2014-03-29 ENCOUNTER — Other Ambulatory Visit (HOSPITAL_COMMUNITY): Payer: Self-pay | Admitting: Nurse Practitioner

## 2014-04-01 ENCOUNTER — Telehealth: Payer: Self-pay | Admitting: Nurse Practitioner

## 2014-04-04 ENCOUNTER — Telehealth: Payer: Self-pay | Admitting: Family Medicine

## 2014-04-04 DIAGNOSIS — M25551 Pain in right hip: Secondary | ICD-10-CM

## 2014-04-04 DIAGNOSIS — M25552 Pain in left hip: Principal | ICD-10-CM

## 2014-04-04 MED ORDER — OXYCODONE-ACETAMINOPHEN 10-325 MG PO TABS: 1.0000 | ORAL_TABLET | Freq: Four times a day (QID) | ORAL | Status: DC | PRN

## 2014-04-04 NOTE — Telephone Encounter (Signed)
Patient aware to pick up 

## 2014-04-04 NOTE — Telephone Encounter (Signed)
rx ready for pickup 

## 2014-04-08 ENCOUNTER — Encounter: Payer: Self-pay | Admitting: Nurse Practitioner

## 2014-04-08 ENCOUNTER — Ambulatory Visit (INDEPENDENT_AMBULATORY_CARE_PROVIDER_SITE_OTHER): Payer: Medicare HMO | Admitting: Nurse Practitioner

## 2014-04-08 VITALS — BP 168/79 | HR 73 | Temp 97.7°F | Ht 71.0 in | Wt 169.0 lb

## 2014-04-08 DIAGNOSIS — I509 Heart failure, unspecified: Secondary | ICD-10-CM

## 2014-04-08 DIAGNOSIS — I70219 Atherosclerosis of native arteries of extremities with intermittent claudication, unspecified extremity: Secondary | ICD-10-CM

## 2014-04-08 DIAGNOSIS — E785 Hyperlipidemia, unspecified: Secondary | ICD-10-CM

## 2014-04-08 DIAGNOSIS — K219 Gastro-esophageal reflux disease without esophagitis: Secondary | ICD-10-CM

## 2014-04-08 DIAGNOSIS — E119 Type 2 diabetes mellitus without complications: Secondary | ICD-10-CM

## 2014-04-08 DIAGNOSIS — Z87891 Personal history of nicotine dependence: Secondary | ICD-10-CM

## 2014-04-08 DIAGNOSIS — I739 Peripheral vascular disease, unspecified: Secondary | ICD-10-CM

## 2014-04-08 DIAGNOSIS — I10 Essential (primary) hypertension: Secondary | ICD-10-CM

## 2014-04-08 LAB — POCT GLYCOSYLATED HEMOGLOBIN (HGB A1C): Hemoglobin A1C: 7.3

## 2014-04-08 MED ORDER — INSULIN GLARGINE 100 UNIT/ML ~~LOC~~ SOLN: 28.0000 [IU] | Freq: Every day | SUBCUTANEOUS | Status: DC

## 2014-04-08 MED ORDER — LISINOPRIL 5 MG PO TABS: 5.0000 mg | ORAL_TABLET | Freq: Every day | ORAL | Status: DC

## 2014-04-08 MED ORDER — CLOPIDOGREL BISULFATE 75 MG PO TABS: 75.0000 mg | ORAL_TABLET | Freq: Once | ORAL | Status: DC

## 2014-04-08 NOTE — Progress Notes (Signed)
Subjective:    Patient ID: [name], male    DOB: 09-17-1946, 68 y.o.   MRN: [id number]  Patient in today for follow up of chronic medical problems.  Diabetes He presents for his follow-up diabetic visit. He has type 2 diabetes mellitus. No MedicAlert identification noted. The initial diagnosis of diabetes was made 30 years ago. His disease course has been stable. There are no hypoglycemic associated symptoms. Pertinent negatives for hypoglycemia include no confusion or pallor. Pertinent negatives for diabetes include no chest pain, no foot paresthesias, no foot ulcerations, no polydipsia, no polyphagia, no polyuria, no visual change, no weakness and no weight loss. There are no hypoglycemic complications. Symptoms are stable. Diabetic complications include heart disease, impotence and peripheral neuropathy (mild). Risk factors for coronary artery disease include male sex, dyslipidemia and hypertension. Current diabetic treatment includes insulin injections (averages 12u on sliding scale with meals.). He is compliant with treatment all of the time. Insulin dose schedule: lantus in AM, and sliding scale novolog at meals. Rotation sites for injection include the abdominal wall. His weight is stable. When asked about meal planning, he reported none. He has not had a previous visit with a dietician. He rarely participates in exercise. His home blood glucose trend is fluctuating dramatically. His breakfast blood glucose is taken between 9-10 am. His breakfast blood glucose range is generally 130-140 mg/dl. His highest blood glucose is >200 mg/dl. His overall blood glucose range is 180-200 mg/dl. (Blood sugar range from low 100's to low 400's) An ACE inhibitor/angiotensin II receptor blocker is being taken. He does not see a podiatrist.Eye exam is current (06/2012).  Hypertension This is a chronic problem. The current episode started more than 1 year ago. The problem is unchanged. The problem is  controlled. Pertinent negatives include no chest pain, neck pain, orthopnea, palpitations, peripheral edema or shortness of breath. There are no associated agents to hypertension. Risk factors for coronary artery disease include diabetes mellitus, dyslipidemia and male gender. Past treatments include ACE inhibitors. The current treatment provides significant improvement. Compliance problems include diet and exercise.   Anemia Presents for follow-up (Last hgb was .13) visit. There has been no abdominal pain, confusion, leg swelling, light-headedness, pallor, palpitations or weight loss. Procedure history includes EGD (years ago). Compliance problems include adherence to diet.  Compliance with medications is 76-100%. Compliance with diet is 26-50%. Side effects of medications include auditory problems.  Gastrophageal Reflux He reports no abdominal pain, no chest pain, no coughing, no dysphagia, no heartburn or no sore throat. This is a chronic problem. The current episode started more than 1 year ago. The problem occurs rarely. The problem has been rapidly improving. Nothing aggravates the symptoms. Associated symptoms include anemia. Pertinent negatives include no weight loss. Risk factors include ETOH use. He has tried a PPI for the symptoms. The treatment provided moderate relief. Past procedures include an EGD (years ago).  Hyperlipidemia This is a chronic problem. The current episode started more than 1 year ago. The problem is controlled. Recent lipid tests were reviewed and are low. Exacerbating diseases include diabetes. Pertinent negatives include no chest pain or shortness of breath. He is currently on no antihyperlipidemic treatment. The current treatment provides significant improvement of lipids. There are no compliance problems.  Risk factors for coronary artery disease include diabetes mellitus, hypertension and male sex.  PVD Plavix dialy- no bleeding that he is aware of . No longer sees PV  surgeon   Review of Systems  Constitutional: Negative for  weight loss.  HENT: Negative for sore throat.   Respiratory: Negative for cough and shortness of breath.   Cardiovascular: Negative for chest pain, palpitations and orthopnea.  Gastrointestinal: Negative for heartburn, dysphagia and abdominal pain.  Endocrine: Negative for polydipsia, polyphagia and polyuria.  Genitourinary: Positive for impotence.  Musculoskeletal: Negative for neck pain.  Skin: Negative for pallor.  Neurological: Negative for weakness and light-headedness.  Psychiatric/Behavioral: Negative for confusion.  All other systems reviewed and are negative.      Objective:   Physical Exam  Constitutional: He is oriented to person, place, and time. He appears well-developed and well-nourished.  HENT:  Head: Normocephalic.  Right Ear: External ear normal.  Left Ear: External ear normal.  Nose: Nose normal.  Mouth/Throat: Oropharynx is clear and moist.  Eyes: EOM are normal. Pupils are equal, round, and reactive to light.  Neck: Normal range of motion. Neck supple. No thyromegaly present.  Cardiovascular: Normal rate, regular rhythm, normal heart sounds and intact distal pulses.   No murmur heard. Pulmonary/Chest: Effort normal and breath sounds normal. He has no wheezes. He has no rales.  Abdominal: Soft. Bowel sounds are normal.  Genitourinary: Prostate normal and penis normal.  Musculoskeletal: Normal range of motion.  Neurological: He is alert and oriented to person, place, and time.  Positive 3/4 monofilament bil feet- unable to feel bil great toe.  Skin: Skin is warm and dry.  Slight callus formation bil heels  Psychiatric: He has a normal mood and affect. His behavior is normal. Judgment and thought content normal.   BP 168/79  Pulse 73  Temp(Src) 97.7 F (36.5 C) (Oral)  Ht _0  (1.803 m)  Wt 169 lb (76.658 kg)  BMI 23.58 kg/m2     Results for orders placed in visit on 04/08/14  POCT  GLYCOSYLATED HEMOGLOBIN (HGB A1C)      Result Value Ref Range   Hemoglobin A1C 7.3        Assessment & Plan:   1. DM (diabetes mellitus)   2. HTN (hypertension)   3. Hyperlipidemia LDL goal < 70   4. History of tobacco abuse   5. GERD (gastroesophageal reflux disease)   6. 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 PVD (peripheral vascular disease)    Orders Placed This Encounter  Procedures  . CMP14+EGFR  . NMR, lipoprofile  . POCT glycosylated hemoglobin (Hb A1C)   Meds ordered this encounter  Medications  . clopidogrel (PLAVIX) 75 MG tablet    Sig: Take 1 tablet (75 mg total) by mouth once.    Dispense:  90 tablet    Refill:  1    Order Specific Question:  Supervising Provider    Answer:  Chipper Herb [[id number]]  . insulin glargine (LANTUS) 100 UNIT/ML injection    Sig: Inject 0.28 mLs (28 Units total) into the skin daily. Inject up to 20 units qd ad    Dispense:  30 mL    Refill:  0    Order Specific Question:  Supervising Provider    Answer:  Chipper Herb [[id number]]  increased lisinopril to 23m daily Increase lantus to 25u X 3 days then to 28u Labs pending Health maintenance reviewed Diet and exercise encouraged Continue all meds Follow up  In 3 months   [place] FNP

## 2014-04-08 NOTE — Patient Instructions (Signed)

## 2014-04-10 LAB — NMR, LIPOPROFILE
CHOLESTEROL: 170 mg/dL (ref 100–199)
HDL Cholesterol by NMR: 60 mg/dL (ref >39)
HDL Particle Number: 34.8 umol/L (ref >=30.5)
LDL PARTICLE NUMBER: 912 nmol/L (ref <1000)
LDL SIZE: 21.2 nm (ref >20.5)
LDLC SERPL CALC-MCNC: 88 mg/dL (ref 0–99)
LP-IR Score: 26 (ref <=45)
Small LDL Particle Number: 304 nmol/L (ref <=527)
Triglycerides by NMR: 109 mg/dL (ref 0–149)

## 2014-04-10 LAB — CMP14+EGFR
ALBUMIN: 4.3 g/dL (ref 3.6–4.8)
ALK PHOS: 80 IU/L (ref 39–117)
ALT: 8 IU/L (ref 0–44)
AST: 14 IU/L (ref 0–40)
Albumin/Globulin Ratio: 1.7 (ref 1.1–2.5)
BUN / CREAT RATIO: 13 (ref 10–22)
BUN: 10 mg/dL (ref 8–27)
CHLORIDE: 103 mmol/L (ref 97–108)
CO2: 24 mmol/L (ref 18–29)
Calcium: 9.4 mg/dL (ref 8.6–10.2)
Creatinine, Ser: 0.77 mg/dL (ref 0.76–1.27)
GFR calc non Af Amer: 93 mL/min/{1.73_m2} (ref >59)
GFR, EST AFRICAN AMERICAN: 108 mL/min/{1.73_m2} (ref >59)
Globulin, Total: 2.5 g/dL (ref 1.5–4.5)
Glucose: 49 mg/dL — ABNORMAL LOW (ref 65–99)
POTASSIUM: 4.2 mmol/L (ref 3.5–5.2)
Sodium: 141 mmol/L (ref 134–144)
Total Bilirubin: <0.2 mg/dL (ref 0.0–1.2)
Total Protein: 6.8 g/dL (ref 6.0–8.5)

## 2014-04-19 ENCOUNTER — Encounter: Payer: Self-pay | Admitting: Physical Medicine & Rehabilitation

## 2014-04-19 ENCOUNTER — Other Ambulatory Visit: Payer: Self-pay | Admitting: Physical Medicine & Rehabilitation

## 2014-04-19 ENCOUNTER — Encounter: Payer: Medicare HMO | Attending: Physical Medicine & Rehabilitation | Admitting: Physical Medicine & Rehabilitation

## 2014-04-19 ENCOUNTER — Other Ambulatory Visit: Payer: Self-pay | Admitting: *Deleted

## 2014-04-19 VITALS — BP 119/54 | HR 68 | Resp 14 | Wt 167.0 lb

## 2014-04-19 DIAGNOSIS — M160 Bilateral primary osteoarthritis of hip: Secondary | ICD-10-CM

## 2014-04-19 DIAGNOSIS — M169 Osteoarthritis of hip, unspecified: Secondary | ICD-10-CM | POA: Insufficient documentation

## 2014-04-19 DIAGNOSIS — M24859 Other specific joint derangements of unspecified hip, not elsewhere classified: Secondary | ICD-10-CM

## 2014-04-19 DIAGNOSIS — M19019 Primary osteoarthritis, unspecified shoulder: Secondary | ICD-10-CM | POA: Insufficient documentation

## 2014-04-19 DIAGNOSIS — M25551 Pain in right hip: Secondary | ICD-10-CM

## 2014-04-19 DIAGNOSIS — R29898 Other symptoms and signs involving the musculoskeletal system: Secondary | ICD-10-CM | POA: Insufficient documentation

## 2014-04-19 DIAGNOSIS — M19012 Primary osteoarthritis, left shoulder: Secondary | ICD-10-CM

## 2014-04-19 DIAGNOSIS — M1610 Unilateral primary osteoarthritis, unspecified hip: Secondary | ICD-10-CM | POA: Insufficient documentation

## 2014-04-19 DIAGNOSIS — M25559 Pain in unspecified hip: Secondary | ICD-10-CM

## 2014-04-19 DIAGNOSIS — M19011 Primary osteoarthritis, right shoulder: Secondary | ICD-10-CM

## 2014-04-19 DIAGNOSIS — M25552 Pain in left hip: Secondary | ICD-10-CM

## 2014-04-19 MED ORDER — OXYCODONE-ACETAMINOPHEN 10-325 MG PO TABS: 1.0000 | ORAL_TABLET | Freq: Four times a day (QID) | ORAL | Status: DC | PRN

## 2014-04-19 MED ORDER — MELOXICAM 7.5 MG PO TABS: 7.5000 mg | ORAL_TABLET | Freq: Every day | ORAL | Status: DC

## 2014-04-19 NOTE — Progress Notes (Signed)
Urine drug screen from 03/16/14 was consistent

## 2014-04-19 NOTE — Progress Notes (Signed)
Subjective:    Patient ID: [name], male    DOB: 05/03/1946, 68 y.o.   MRN: [id number]  HPI  [name] is back regarding his chronic left hip and shoulder pain. He didn't go to therapy because "they called me before I got home, and I knew they must be a racket trying to get my money!". His hip xrays showed a ?lucency in the left greater troch. I didn't really see anything overly impressive. He is using the oxycodone for pain which helps some. He takes goody powder which helps more than anything.  He is not icing or stretching the hip.  Right shoulder showed osteoarthritic changes      Pain Inventory Average Pain 8 Pain Right Now 5 My pain is stabbing  In the last 24 hours, has pain interfered with the following? General activity 10 Relation with others 10 Enjoyment of life 10 What TIME of day is your pain at its worst? all Sleep (in general) Poor  Pain is worse with: walking and bending Pain improves with: medication Relief from Meds: 8  Mobility walk without assistance how many minutes can you walk? 5-10 do you drive?  yes  Function retired  Neuro/Psych No problems in this area  Prior Studies Any changes since last visit?  no  Physicians involved in your care Any changes since last visit?  no   Family History  Problem Relation Age of Onset  . Cancer Mother    History   Social History  . Marital Status: Widowed    Spouse Name: N/A    Number of Children: N/A  . Years of Education: N/A   Social History Main Topics  . Smoking status: Former Smoker -- 3.00 packs/day for 40 years    Types: Cigarettes    Quit date: 01/16/2010  . Smokeless tobacco: Never Used  . Alcohol Use: 7.2 oz/week    12 Cans of beer per week  . Drug Use: No  . Sexual Activity: None   Other Topics Concern  . None   Social History Narrative  . None   Past Surgical History  Procedure Laterality Date  . Total hip arthroplasty      left hip  . Pr vein bypass  graft,aorto-fem-pop  03-06-11    Right Common Femoral to BK popliteal BPG  . Hernia repair  1953  . Joint replacement     Past Medical History  Diagnosis Date  . 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 Arthritis   . Reflux   . Constipation 02/12/11  . Leg pain 02/12/11    with walking  . Joint pain 02/12/11    right shoulder  . Hypertension   . Anemia   . Peripheral vascular disease   . Anemia 01/07/2012  . History of tobacco abuse 01/07/2012  . Shortness of breath   . Pneumonia   . GERD (gastroesophageal reflux disease)    BP 119/54  Pulse 68  Resp 14  Wt 167 lb (75.751 kg)  SpO2 99%  Opioid Risk Score:   Fall Risk Score: Moderate Fall Risk (6-13 points) (educated and handout declined for fall prevention in the home at previous visit)  Review of Systems  Musculoskeletal: Positive for back pain.       Shoulder pain  All other systems reviewed and are negative.      Objective:   Physical Exam  General: Alert and oriented x 3, No apparent distress, doesn't appear in pain  HEENT: Head is normocephalic, atraumatic, PERRLA, EOMI, sclera anicteric, oral mucosa pink and moist, dentition intact, ext ear canals clear,  Neck: Supple without JVD or lymphadenopathy  Heart: Reg rate and rhythm. No murmurs rubs or gallops  Chest: CTA bilaterally without wheezes, rales, or rhonchi; no distress  Abdomen: Soft, non-tender, non-distended, bowel sounds positive.  Extremities: No clubbing, cyanosis, or edema. Feet are cool.  Skin: Clean and intact without signs of breakdown  Neuro: Pt is cognitively appropriate with normal insight, memory, and awareness. Cranial nerves 2-12 are intact. Sensory exam is normal except for distally over the toes on both feet. . Reflexes are 2+ in all 4's. Fine motor coordination is intact. No tremors. Motor function is grossly 5/5.  Musculoskeletal:  pain in bilateral shoulders, right more than left with IR/ER. +impingement maneuvers bilaterally.  Biceps tendons only mildly tender. Cross arm maneuver equivocal. He decreased scapular mobility. Low back minimally tender with rom. He seemed to have more pain with bending than anything else. Paraspinals were a little tight. No gross asymmetries. Greater trochs were tender. But left hip pain was more proximal near the origination of the TFL. He had mild to moderate pain with patrick's test bilaterally, left more than right. Gait is wide based and he seems to step down into the left leg a bit. I measured leg length and there may have been an 1/8 discrepancy.  Psych: Pt's affect is appropriate. Pt is cooperative   Assessment & Plan:   1. 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 Hx of PVD s/p BPG--mild peripheral neuropathy  4. Snapping hip syndrome/TFL   Plan:  1. ITB exercises were provided today (pt states he had some things similar at home but wasn't using). Also i asked him to try a small left heel lift (1/8 inch). 2. Meloxicam 7.5mg  for two weeks. Avoid long term use given history. Told him NOT to use BC powder 3. Percocet refilled #120 today  4. Consider injection to proximal TFL.  5. I or NP will see him back in about a month. 45 minutes of face to face patient care time were spent during this visit. All questions were encouraged and answered.

## 2014-04-19 NOTE — Patient Instructions (Signed)
Iliotibial Band Syndrome  with Rehab  The iliotibial (IT) band is a tendon that connects the hip muscles to the shinbone (tibia) and to one of the bones of the pelvis (ileum). The IT band passes by the knee and is often irritated by the outer portion of the knee (lateral femoral condyle). A fluid filled sac (bursa) exists between the tendon and the bone, to cushion and reduce friction. Overuse of the tendon may cause excessive friction, which results in IT band syndrome. This condition involves inflammation of the bursa (bursitis) and/or inflammation of the IT band (tendinitis).  SYMPTOMS   · Pain, tenderness, swelling, warmth, or redness over the IT band, at the outer knee (above the joint).  · Pain that travels up or down the thigh or leg.  · Initially, pain at the beginning of an exercise, that decreases once warmed up. Eventually, pain throughout the activity, getting worse as the activity continues. May cause the athlete to stop in the middle of training or competing.  · Pain that gets worse when running down hills or stairs, on banked tracks, or next to the curb on the street.  · Pain that increases when the foot of the affected leg hits the ground.  · Possibly, a crackling sound (crepitation) when the tendon or bursa is moved or touched.  CAUSES   IT band syndrome is caused by irritation of the IT band and the underlying bursa. This eventually results in inflammation and pain. IT band syndrome is an overuse injury.   RISK INCREASES WITH:  · Sports with repetitive knee-bending activities (distance running, cycling).  · Incorrect training techniques, including sudden changes in the intensity, frequency, or duration of training.  · Not enough rest between workouts.  · Poor strength and flexibility, especially a tight IT band.  · Failure to warm up properly before activity.  · Bow legs.  · Arthritis of the knee.  PREVENTION   · Warm up and stretch properly before activity.  · Allow for adequate recovery between  workouts.  · Maintain physical fitness:  · Strength, flexibility, and endurance.  · Cardiovascular fitness.  · Learn and use proper training technique, including reducing running mileage, shortening stride, and avoiding running on hills and banked surfaces.  · Wear arch supports (orthotics), if you have flat feet.  PROGNOSIS   If treated properly, IT band syndrome usually goes away within 6 weeks of treatment.  RELATED COMPLICATIONS   · Longer healing time, if not properly treated, or if not given enough time to heal.  · Recurring inflammation of the tendon and bursa, that may result in a chronic condition.  · Recurring symptoms, if activity is resumed too soon, with overuse, with a direct blow, or with poor training technique.  · Inability to complete training or competition.  TREATMENT   Treatment first involves the use of ice and medicine, to reduce pain and inflammation. The use of strengthening and stretching exercises may help reduce pain with activity. These exercises may be performed at home or with a therapist. For individuals with flat feet, an arch support (orthotic) may be helpful. Some individuals find that wearing a knee sleeve or compression bandage around the knee during workouts provides some relief. Certain training techniques, such as adjusting stride length, avoiding running on hills or stairs, changing the direction you run on a circular or banked track, or changing the side of the road you run on, if you run next to the curb, may help decrease symptoms   of IT band syndrome. Cyclists may need to change the seat height or foot position on their bicycles. An injection of cortisone into the bursa may be recommended. Surgery to remove the inflamed bursa and/or part of the IT band is only considered after at least 6 months of non-surgical treatment.   MEDICATION   · If pain medicine is needed, nonsteroidal anti-inflammatory medicines (aspirin and ibuprofen), or other minor pain relievers  (acetaminophen), are often advised.  · Do not take pain medicine for 7 days before surgery.  · Prescription pain relievers may be given, if your caregiver thinks they are needed. Use only as directed and only as much as you need.  · Corticosteroid injections may be given by your caregiver. These injections should be reserved for the most serious cases, because they may only be given a certain number of times.  HEAT AND COLD  · Cold treatment (icing) should be applied for 10 to 15 minutes every 2 to 3 hours for inflammation and pain, and immediately after activity that aggravates your symptoms. Use ice packs or an ice massage.  · Heat treatment may be used before performing stretching and strengthening activities prescribed by your caregiver, physical therapist, or athletic trainer. Use a heat pack or a warm water soak.  SEEK MEDICAL CARE IF:   · Symptoms get worse or do not improve in 2 to 4 weeks, despite treatment.  · New, unexplained symptoms develop. (Drugs used in treatment may produce side effects.)  EXERCISES   RANGE OF MOTION (ROM) AND STRETCHING EXERCISES - Iliotibial Band Syndrome  These exercises may help you when beginning to rehabilitate your injury. Your symptoms may go away with or without further involvement from your physician, physical therapist or athletic trainer. While completing these exercises, remember:   · Restoring tissue flexibility helps normal motion to return to the joints. This allows healthier, less painful movement and activity.  · An effective stretch should be held for at least 30 seconds.  · A stretch should never be painful. You should only feel a gentle lengthening or release in the stretched tissue.  STRETCH - Quadriceps, Prone   · Lie on your stomach on a firm surface, such as a bed or padded floor.  · Bend your right / left knee and grasp your ankle. If you are unable to reach your ankle or pant leg, use a belt around your foot to lengthen your reach.  · Gently pull your heel  toward your buttocks. Your knee should not slide out to the side. You should feel a stretch in the front of your thigh and knee.  · Hold this position for __________ seconds.  Repeat __________ times. Complete this stretch __________ times per day.   STRETCH  Iliotibial Band  · On the floor or bed, lie on your side, so your right / left leg is on top. Bend your knee and grab your ankle.  · Slowly bring your knee back so that your thigh is in line with your trunk. Keep your heel at your buttocks and gently arch your back, so your head, shoulders and hips line up.  · Slowly lower your leg so that your knee approaches the floor or bed, until you feel a gentle stretch on the outside of your right / left thigh. If you do not feel a stretch and your knee will not fall farther, place the heel of your opposite foot on top of your knee, and pull your thigh down farther.  ·   Hold this stretch for __________ seconds.  Repeat __________ times. Complete this stretch __________ times per day.  STRENGTHENING EXERCISES - Iliotibial Band Syndrome  Improving the flexibility of the IT band will best relieve your discomfort due to IT band syndrome. Strengthening exercises, however, can help improve both muscle endurance and joint mechanics, reducing the factors that can contribute to this condition. Your physician, physical therapist or athletic trainer may provide you with exercises that train specific muscle groups that are especially weak. The following exercises target muscles that are often weak in people who have IT band syndrome.  STRENGTH - Hip Abductors, Straight Leg Raises   Be aware of your form throughout the entire exercise, so that you exercise the correct muscles. Poor form means that you are not strengthening the correct muscles.  · Lie on your side, so that your head, shoulders, knee and hip line up. You may bend your lower knee to help maintain your balance. Your right / left leg should be on top.  · Roll your hips  slightly forward, so that your hips are stacked directly over each other and your right / left knee is facing forward.  · Lift your top leg up 4-6 inches, leading with your heel. Be sure that your foot does not drift forward and that your knee does not roll toward the ceiling.  · Hold this position for __________ seconds. You should feel the muscles in your outer hip lifting (you may not notice this until your leg begins to tire).  · Slowly lower your leg to the starting position. Allow the muscles to fully relax before beginning the next repetition.  Repeat __________ times. Complete this exercise __________ times per day.   STRENGTH - Quad/VMO, Isometric  · Sit in a chair with your right / left knee slightly bent. With your fingertips, feel the VMO muscle (just above the inside of your knee). The VMO is important in controlling the position of your kneecap.  · Keeping your fingertips on this muscle. Without actually moving your leg, attempt to drive your knee down, as if straightening your leg. You should feel your VMO tense. If you have a difficult time, you may wish to try the same exercise on your healthy knee first.  · Tense this muscle as hard as you can, without increasing any knee pain.  · Hold for __________ seconds. Relax the muscles slowly and completely between each repetition.  Repeat __________ times. Complete this exercise __________ times per day.   Document Released: 11/04/2005 Document Revised: 01/27/2012 Document Reviewed: 02/16/2009  [employer]® Patient Information ©2014 [employer].

## 2014-05-02 ENCOUNTER — Telehealth: Payer: Self-pay | Admitting: Nurse Practitioner

## 2014-05-02 NOTE — Telephone Encounter (Signed)
Looks like Dr. Tessa Lerner gave him rx on 04/19/14- cannot get from 2 different providers- and it is also to early for refill

## 2014-05-03 NOTE — Telephone Encounter (Signed)
Patient wants to bring you this RX because he only wants you to do. He wants to talk to you about this. He said this is not something he wants to do. please advise

## 2014-05-04 NOTE — Telephone Encounter (Signed)
Patient was in this morning and mmm talked to him

## 2014-05-04 NOTE — Telephone Encounter (Signed)
Cannot do anything- Dr. Harvin Hazel is pain management and he needs to stay with pain management- We are trying to get away from doing pain management here- Needs to stay with Dr. Tessa Lerner if has gotten in there- VEry difficult to get in with pain management.

## 2014-05-31 ENCOUNTER — Telehealth: Payer: Self-pay

## 2014-05-31 DIAGNOSIS — M25551 Pain in right hip: Secondary | ICD-10-CM

## 2014-05-31 DIAGNOSIS — M25552 Pain in left hip: Principal | ICD-10-CM

## 2014-05-31 MED ORDER — OXYCODONE-ACETAMINOPHEN 10-325 MG PO TABS: 1.0000 | ORAL_TABLET | Freq: Four times a day (QID) | ORAL | Status: DC | PRN

## 2014-05-31 NOTE — Telephone Encounter (Signed)
Patient is requesting a refill on Oxycodone. Last fill 6/2. Patient has appt on 7/21 but will be out of medication. RX printed for Dr. Naaman Plummer to sign. Will contact patient when ready for pickup.

## 2014-05-31 NOTE — Telephone Encounter (Signed)
Left VM for [name] to call our office.  His prescription is available for pick up.

## 2014-06-01 NOTE — Telephone Encounter (Signed)
Notified RX available for pick up

## 2014-06-07 ENCOUNTER — Encounter
Payer: Commercial Managed Care - HMO | Attending: Physical Medicine & Rehabilitation | Admitting: Physical Medicine & Rehabilitation

## 2014-06-07 DIAGNOSIS — M169 Osteoarthritis of hip, unspecified: Secondary | ICD-10-CM | POA: Insufficient documentation

## 2014-06-07 DIAGNOSIS — M19019 Primary osteoarthritis, unspecified shoulder: Secondary | ICD-10-CM | POA: Insufficient documentation

## 2014-06-07 DIAGNOSIS — R29898 Other symptoms and signs involving the musculoskeletal system: Secondary | ICD-10-CM | POA: Insufficient documentation

## 2014-06-07 DIAGNOSIS — M1610 Unilateral primary osteoarthritis, unspecified hip: Secondary | ICD-10-CM | POA: Insufficient documentation

## 2014-06-07 DIAGNOSIS — M25559 Pain in unspecified hip: Secondary | ICD-10-CM | POA: Insufficient documentation

## 2014-06-24 ENCOUNTER — Other Ambulatory Visit: Payer: Self-pay | Admitting: Nurse Practitioner

## 2014-06-27 ENCOUNTER — Encounter: Payer: Self-pay | Admitting: Nurse Practitioner

## 2014-06-27 ENCOUNTER — Ambulatory Visit (INDEPENDENT_AMBULATORY_CARE_PROVIDER_SITE_OTHER): Payer: Medicare HMO | Admitting: Nurse Practitioner

## 2014-06-27 VITALS — BP 130/74 | HR 74 | Temp 97.1°F | Wt 161.0 lb

## 2014-06-27 DIAGNOSIS — R5383 Other fatigue: Secondary | ICD-10-CM

## 2014-06-27 DIAGNOSIS — R5381 Other malaise: Secondary | ICD-10-CM

## 2014-06-27 DIAGNOSIS — G4700 Insomnia, unspecified: Secondary | ICD-10-CM

## 2014-06-27 DIAGNOSIS — D509 Iron deficiency anemia, unspecified: Secondary | ICD-10-CM

## 2014-06-27 LAB — POCT HEMOGLOBIN: Hemoglobin: 14.3 g/dL (ref 14.1–18.1)

## 2014-06-27 NOTE — Progress Notes (Signed)
   Subjective:    Patient ID: [name], male    DOB: 1946/03/13, 68 y.o.   MRN: [id number]  HPI  Patient in c/o fatigue- he has a long history of anemia and has had to have multiple blood transfusions- He is in today requesting that we do his hgb to make sure it is not down again. He has had multiple test run and was in the hospital for over a week in the past and they could not fond where he was losing blood from. Sya sthat he does not sleep well and that that may be the problem.    Review of Systems  HENT: Negative.   Respiratory: Negative.   Cardiovascular: Negative.   Neurological: Positive for dizziness and light-headedness. Negative for headaches.  Psychiatric/Behavioral: Negative.   All other systems reviewed and are negative.      Objective:   Physical Exam  Constitutional: He is oriented to person, place, and time. He appears well-developed and well-nourished.  Cardiovascular: Normal rate, regular rhythm and normal heart sounds.   Pulmonary/Chest: Effort normal and breath sounds normal.  Neurological: He is alert and oriented to person, place, and time.  Skin: Skin is warm and dry.  Psychiatric: He has a normal mood and affect. His behavior is normal. Judgment and thought content normal.   BP 130/74  Pulse 74  Temp(Src) 97.1 F (36.2 C) (Oral)  Wt 161 lb (73.029 kg)  Results for orders placed in visit on 06/27/14  POCT HEMOGLOBIN      Result Value Ref Range   Hemoglobin 14.3  14.1 - 18.1 g/dL          Assessment & Plan:   1. Iron deficiency anemia   2. Other malaise and fatigue   3. Insomnia    Try melatonin OTC rest Keep follow up appointment in 2 weeks  Mary-Margaret Hassell Done, FNP

## 2014-06-27 NOTE — Patient Instructions (Signed)

## 2014-06-29 ENCOUNTER — Telehealth: Payer: Self-pay

## 2014-06-29 NOTE — Telephone Encounter (Signed)
Patient is requesting a refill on Oxycodone. Last OV was 04/2014. Patient no showed for his July appt, and next appt is 9/9.

## 2014-06-29 NOTE — Telephone Encounter (Signed)
Patient returned call to clinic. Informed patient taht he would need a appt before he could get a refill. Patient cursed and stated "he couldn't afford to buy the medication of the streets" Scheduled patient appt with Zella Ball on 8/13 @ 9:00. Will do a UDS then and patient also needs to sign a CSA.

## 2014-06-29 NOTE — Telephone Encounter (Signed)
Attempted to contact patient. No answer nor voicemail.  

## 2014-06-30 ENCOUNTER — Encounter: Payer: Medicare HMO | Attending: Physical Medicine & Rehabilitation | Admitting: Registered Nurse

## 2014-06-30 ENCOUNTER — Encounter: Payer: Self-pay | Admitting: Registered Nurse

## 2014-06-30 VITALS — BP 160/79 | HR 74 | Resp 14 | Ht 71.0 in | Wt 163.0 lb

## 2014-06-30 DIAGNOSIS — R29898 Other symptoms and signs involving the musculoskeletal system: Secondary | ICD-10-CM | POA: Diagnosis not present

## 2014-06-30 DIAGNOSIS — M169 Osteoarthritis of hip, unspecified: Secondary | ICD-10-CM | POA: Diagnosis present

## 2014-06-30 DIAGNOSIS — M25552 Pain in left hip: Secondary | ICD-10-CM

## 2014-06-30 DIAGNOSIS — Z79899 Other long term (current) drug therapy: Secondary | ICD-10-CM

## 2014-06-30 DIAGNOSIS — M1610 Unilateral primary osteoarthritis, unspecified hip: Secondary | ICD-10-CM

## 2014-06-30 DIAGNOSIS — M25559 Pain in unspecified hip: Secondary | ICD-10-CM | POA: Diagnosis present

## 2014-06-30 DIAGNOSIS — Z5181 Encounter for therapeutic drug level monitoring: Secondary | ICD-10-CM | POA: Diagnosis present

## 2014-06-30 DIAGNOSIS — M25551 Pain in right hip: Secondary | ICD-10-CM

## 2014-06-30 DIAGNOSIS — M19019 Primary osteoarthritis, unspecified shoulder: Secondary | ICD-10-CM | POA: Diagnosis present

## 2014-06-30 MED ORDER — OXYCODONE-ACETAMINOPHEN 10-325 MG PO TABS: 1.0000 | ORAL_TABLET | Freq: Four times a day (QID) | ORAL | Status: DC | PRN

## 2014-06-30 NOTE — Progress Notes (Signed)
Subjective:    Patient ID: [name], male    DOB: 27-Nov-1945, 68 y.o.   MRN: [id number]  HPI: [name] is a 68 year old male who returns for follow up for chronic pain and medication refill. He says his pain is located in his bilateral shoulder's and left hip. He rates his pain 8. His current exercise regime is performing stretching exercises, walking and gardening.  Pain Inventory Average Pain 9 Pain Right Now 8 My pain is stabbing  In the last 24 hours, has pain interfered with the following? General activity 10 Relation with others 10 Enjoyment of life 10 What TIME of day is your pain at its worst? constant all day Sleep (in general) Poor  Pain is worse with: some activites Pain improves with: medication Relief from Meds: 8  Mobility walk without assistance how many minutes can you walk? 10 ability to climb steps?  yes do you drive?  yes transfers alone  Function not employed: date last employed 2000 Do you have any goals in this area?  yes  Neuro/Psych trouble walking dizziness depression  Prior Studies Any changes since last visit?  no  Physicians involved in your care Any changes since last visit?  no   Family History  Problem Relation Age of Onset  . Cancer Mother    History   Social History  . Marital Status: Widowed    Spouse Name: N/A    Number of Children: N/A  . Years of Education: N/A   Social History Main Topics  . Smoking status: Former Smoker -- 3.00 packs/day for 40 years    Types: Cigarettes    Quit date: 01/16/2010  . Smokeless tobacco: Never Used  . Alcohol Use: 7.2 oz/week    12 Cans of beer per week  . Drug Use: No  . Sexual Activity: None   Other Topics Concern  . None   Social History Narrative  . None   Past Surgical History  Procedure Laterality Date  . Total hip arthroplasty      left hip  . Pr vein bypass graft,aorto-fem-pop  03-06-11    Right Common Femoral to BK popliteal BPG  . Hernia  repair  1953  . Joint replacement     Past Medical History  Diagnosis Date  . Diabetes mellitus age 58    insulin dependent  . Arthritis   . Reflux   . Constipation 02/12/11  . Leg pain 02/12/11    with walking  . Joint pain 02/12/11    right shoulder  . Hypertension   . Anemia   . Peripheral vascular disease   . Anemia 01/07/2012  . History of tobacco abuse 01/07/2012  . Shortness of breath   . Pneumonia   . GERD (gastroesophageal reflux disease)    BP 160/79  Pulse 74  Resp 14  Ht 5\' 11"  (1.803 m)  Wt 163 lb (73.936 kg)  BMI 22.74 kg/m2  SpO2 96%  Opioid Risk Score:   Fall Risk Score: Moderate Fall Risk (6-13 points) (pt educated, brochure declined)   Review of Systems  Constitutional: Positive for diaphoresis.  Gastrointestinal: Positive for constipation.  Musculoskeletal: Positive for back pain and gait problem.  Neurological: Positive for dizziness.  Hematological: Bruises/bleeds easily.  Psychiatric/Behavioral:       Depression  All other systems reviewed and are negative.      Objective:   Physical Exam  Nursing note and vitals reviewed. Constitutional: He is oriented to  person, place, and time. He appears well-developed and well-nourished.  HENT:  Head: Normocephalic and atraumatic.  Neck: Normal range of motion. Neck supple.  Cardiovascular: Normal rate and regular rhythm.   Pulmonary/Chest: Effort normal.  Musculoskeletal:  Normal Muscle Bulk and Muscle testing reveals: Upper Extremities: Full ROM and Muscle strength 5/5 Left Greater Trochanteric Tenderness Lower extremities: Full ROM and Muscle Strength 5/5 Left Leg Flexion Produces pain into Left Hip Arises from chair with ease Narrow based Gait   Neurological: He is alert and oriented to person, place, and time.  Skin: Skin is warm and dry.  Psychiatric: He has a normal mood and affect.          Assessment & Plan:  1. Bilateral hip pain with likely OA of hips and hx of left THA after  fx in 1995. Complaing of Left Hip Pain Today:  Refilled: oxycodone 10/325mg  one tablet every 6 hours as needed #120. 2. 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  30 minutes of face to face patient care time was spent during this visit. All questions were encouraged and answered.   F/U in 1 month

## 2014-07-12 ENCOUNTER — Other Ambulatory Visit: Payer: Self-pay | Admitting: Nurse Practitioner

## 2014-07-18 ENCOUNTER — Encounter: Payer: Self-pay | Admitting: Nurse Practitioner

## 2014-07-18 ENCOUNTER — Ambulatory Visit (INDEPENDENT_AMBULATORY_CARE_PROVIDER_SITE_OTHER): Payer: Medicare HMO | Admitting: Nurse Practitioner

## 2014-07-18 VITALS — BP 126/74 | Temp 97.7°F | Ht 71.0 in | Wt 165.0 lb

## 2014-07-18 DIAGNOSIS — K219 Gastro-esophageal reflux disease without esophagitis: Secondary | ICD-10-CM

## 2014-07-18 DIAGNOSIS — D509 Iron deficiency anemia, unspecified: Secondary | ICD-10-CM

## 2014-07-18 DIAGNOSIS — E11329 Type 2 diabetes mellitus with mild nonproliferative diabetic retinopathy without macular edema: Secondary | ICD-10-CM

## 2014-07-18 DIAGNOSIS — E119 Type 2 diabetes mellitus without complications: Secondary | ICD-10-CM

## 2014-07-18 DIAGNOSIS — G8929 Other chronic pain: Secondary | ICD-10-CM

## 2014-07-18 DIAGNOSIS — E113299 Type 2 diabetes mellitus with mild nonproliferative diabetic retinopathy without macular edema, unspecified eye: Secondary | ICD-10-CM

## 2014-07-18 DIAGNOSIS — M549 Dorsalgia, unspecified: Secondary | ICD-10-CM

## 2014-07-18 DIAGNOSIS — I10 Essential (primary) hypertension: Secondary | ICD-10-CM

## 2014-07-18 DIAGNOSIS — E785 Hyperlipidemia, unspecified: Secondary | ICD-10-CM

## 2014-07-18 DIAGNOSIS — E1139 Type 2 diabetes mellitus with other diabetic ophthalmic complication: Secondary | ICD-10-CM

## 2014-07-18 DIAGNOSIS — N529 Male erectile dysfunction, unspecified: Secondary | ICD-10-CM

## 2014-07-18 DIAGNOSIS — N5201 Erectile dysfunction due to arterial insufficiency: Secondary | ICD-10-CM

## 2014-07-18 DIAGNOSIS — G4700 Insomnia, unspecified: Secondary | ICD-10-CM

## 2014-07-18 LAB — POCT GLYCOSYLATED HEMOGLOBIN (HGB A1C): Hemoglobin A1C: 7.3

## 2014-07-18 LAB — POCT UA - MICROALBUMIN: MICROALBUMIN (UR) POC: NEGATIVE mg/L

## 2014-07-18 MED ORDER — TRAZODONE HCL 50 MG PO TABS: 25.0000 mg | ORAL_TABLET | Freq: Every evening | ORAL | Status: DC | PRN

## 2014-07-18 MED ORDER — INSULIN GLARGINE 100 UNIT/ML SOLOSTAR PEN: 28.0000 [IU] | PEN_INJECTOR | Freq: Every day | SUBCUTANEOUS | Status: DC

## 2014-07-18 MED ORDER — INSULIN ASPART 100 UNIT/ML FLEXPEN: PEN_INJECTOR | SUBCUTANEOUS | Status: DC

## 2014-07-18 NOTE — Progress Notes (Signed)
Subjective:    Patient ID: [name], male    DOB: Apr 16, 1946, 68 y.o.   MRN: [id number]  Patient in today for follow up of chronic medical problems.  Having difficulty sleeping each night with many awakenings during the night.  Having chronic left hip, cervical and lumbar pain.  Is seeing pain management for chronic left hip pain and taking Percocet 10/363m 1 po qid prn.  Diabetes He presents for his follow-up diabetic visit. He has type 2 diabetes mellitus. No MedicAlert identification noted. The initial diagnosis of diabetes was made 30 years ago. His disease course has been stable. There are no hypoglycemic associated symptoms. Pertinent negatives for hypoglycemia include no confusion or pallor. Pertinent negatives for diabetes include no chest pain, no foot paresthesias, no foot ulcerations, no polydipsia, no polyphagia, no polyuria, no visual change, no weakness and no weight loss. There are no hypoglycemic complications. Symptoms are stable. Diabetic complications include heart disease, impotence and peripheral neuropathy (mild). Risk factors for coronary artery disease include male sex, dyslipidemia and hypertension. Current diabetic treatment includes insulin injections (averages 12u on sliding scale with meals.). He is compliant with treatment all of the time. Insulin dose schedule: lantus in AM, and sliding scale novolog at meals. Rotation sites for injection include the abdominal wall. His weight is stable. When asked about meal planning, he reported none. He has not had a previous visit with a dietician. He rarely participates in exercise. His home blood glucose trend is fluctuating dramatically. His breakfast blood glucose is taken between 9-10 am. His breakfast blood glucose range is generally 130-140 mg/dl. His highest blood glucose is >200 mg/dl. His overall blood glucose range is 180-200 mg/dl. (Blood sugar range from low 100's to low 400's) An ACE inhibitor/angiotensin II receptor  blocker is being taken. He does not see a podiatrist.Eye exam is current (06/2012).  Hypertension This is a chronic problem. The current episode started more than 1 year ago. The problem is unchanged. The problem is controlled. Associated symptoms include neck pain. Pertinent negatives include no chest pain, orthopnea, palpitations, peripheral edema or shortness of breath. There are no associated agents to hypertension. Risk factors for coronary artery disease include diabetes mellitus, dyslipidemia and male gender. Past treatments include ACE inhibitors. The current treatment provides significant improvement. Compliance problems include diet and exercise.   Anemia Presents for follow-up (Last hgb was .13) visit. There has been no abdominal pain, confusion, leg swelling, light-headedness, pallor, palpitations or weight loss. Procedure history includes EGD (years ago). Compliance problems include adherence to diet.  Compliance with medications is 76-100%. Compliance with diet is 26-50%. Side effects of medications include auditory problems.  Gastrophageal Reflux He reports no abdominal pain, no chest pain, no coughing, no dysphagia, no heartburn or no sore throat. This is a chronic problem. The current episode started more than 1 year ago. The problem occurs rarely. The problem has been rapidly improving. Nothing aggravates the symptoms. Associated symptoms include anemia. Pertinent negatives include no weight loss. Risk factors include ETOH use. He has tried a PPI for the symptoms. The treatment provided moderate relief. Past procedures include an EGD (years ago).  Hyperlipidemia This is a chronic problem. The current episode started more than 1 year ago. The problem is controlled. Recent lipid tests were reviewed and are low. Exacerbating diseases include diabetes. Pertinent negatives include no chest pain or shortness of breath. He is currently on no antihyperlipidemic treatment. The current treatment  provides significant improvement of lipids. There are no  compliance problems.  Risk factors for coronary artery disease include diabetes mellitus, hypertension and male sex.  PVD Plavix dialy- no bleeding that he is aware of . No longer sees PV surgeon Chronic back and left hip pain Seeing pain management in [facility] Dr Naaman Plummer currently and taking Percocet 10/322m 1 po QID prn.   Review of Systems  Constitutional: Negative for weight loss.  HENT: Negative for sore throat.   Respiratory: Negative for cough and shortness of breath.   Cardiovascular: Negative for chest pain, palpitations and orthopnea.  Gastrointestinal: Negative for heartburn, dysphagia and abdominal pain.  Endocrine: Negative for polydipsia, polyphagia and polyuria.  Genitourinary: Positive for impotence.  Musculoskeletal: Positive for arthralgias and neck pain.  Skin: Negative for pallor.  Neurological: Negative for weakness and light-headedness.  Psychiatric/Behavioral: Negative for confusion.  All other systems reviewed and are negative.      Objective:   Physical Exam  Constitutional: He is oriented to person, place, and time. He appears well-developed and well-nourished.  Eyes: EOM are normal.  Cardiovascular: Normal rate, regular rhythm, normal heart sounds and intact distal pulses.   No murmur heard. Pulmonary/Chest: Effort normal and breath sounds normal. He has no wheezes. He has no rales.  Abdominal: Soft. Bowel sounds are normal.  Musculoskeletal: Normal range of motion. He exhibits tenderness.  Cervical pain with radiation to left shoulder. No numbness or tingling.  Lip hip pain with abduction and adduction.  Neurological: He is alert and oriented to person, place, and time.  Monofilament 4/4 in left foot and 3/4 in right foot with no callus formation   Skin: Skin is warm and dry.  Slight callus formation bil heels  Psychiatric: He has a normal mood and affect. His behavior is normal. Judgment and  thought content normal.   BP 126/74  Temp(Src) 97.7 F (36.5 C) (Oral)  Ht 5' 11"  (1.803 m)  Wt 165 lb (74.844 kg)  BMI 23.02 kg/m2     Results for orders placed in visit on 07/18/14  POCT GLYCOSYLATED HEMOGLOBIN (HGB A1C)      Result Value Ref Range   Hemoglobin A1C 7.3          Assessment & Plan:   1. Type 2 diabetes mellitus without complication   2. Essential hypertension   3. Hyperlipidemia with target LDL less than 70   4. Iron deficiency anemia   5. Gastroesophageal reflux disease without esophagitis   6. Erectile dysfunction due to arterial insufficiency   7. 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 Chronic back pain   9. Insomnia    Orders Placed This Encounter  Procedures  . CMP14+EGFR  . NMR, lipoprofile  . POCT glycosylated hemoglobin (Hb A1C)  . POCT UA - Microalbumin   Meds ordered this encounter  Medications  . Insulin Glargine (LANTUS SOLOSTAR) 100 UNIT/ML Solostar Pen    Sig: Inject 28 Units into the skin daily at 10 pm.    Dispense:  15 pen    Refill:  PRN    Order Specific Question:  Supervising Provider    Answer:  MChipper Herb[[id number]]  . insulin aspart (NOVOLOG FLEXPEN) 100 UNIT/ML FlexPen    Sig: Up to 15 u with meals   Dx 250.02    Dispense:  15 mL    Refill:  11    Order Specific Question:  Supervising Provider    Answer:  MChipper Herb[[id number]]  . traZODone (DESYREL)  50 MG tablet    Sig: Take 0.5-1 tablets (25-50 mg total) by mouth at bedtime as needed for sleep.    Dispense:  30 tablet    Refill:  3    Order Specific Question:  Supervising Provider    Answer:  Chipper Herb [[id number]]    Encouraged to do hemocult cards Labs pending Health maintenance reviewed Diet and exercise encouraged Continue all meds Follow up  In 3 months   [place], FNP

## 2014-07-18 NOTE — Patient Instructions (Signed)

## 2014-07-19 LAB — NMR, LIPOPROFILE
Cholesterol: 176 mg/dL (ref 100–199)
HDL Cholesterol by NMR: 65 mg/dL (ref >39)
HDL Particle Number: 37.2 umol/L (ref >=30.5)
LDL PARTICLE NUMBER: 853 nmol/L (ref <1000)
LDL SIZE: 21.1 nm (ref >20.5)
LDLC SERPL CALC-MCNC: 90 mg/dL (ref 0–99)
LP-IR SCORE: 26 (ref <=45)
Small LDL Particle Number: 300 nmol/L (ref <=527)
Triglycerides by NMR: 103 mg/dL (ref 0–149)

## 2014-07-19 LAB — CMP14+EGFR
ALK PHOS: 70 IU/L (ref 39–117)
ALT: 13 IU/L (ref 0–44)
AST: 12 IU/L (ref 0–40)
Albumin/Globulin Ratio: 2.1 (ref 1.1–2.5)
Albumin: 4.5 g/dL (ref 3.6–4.8)
BILIRUBIN TOTAL: 0.4 mg/dL (ref 0.0–1.2)
BUN / CREAT RATIO: 13 (ref 10–22)
BUN: 14 mg/dL (ref 8–27)
CHLORIDE: 102 mmol/L (ref 97–108)
CO2: 25 mmol/L (ref 18–29)
Calcium: 9.2 mg/dL (ref 8.6–10.2)
Creatinine, Ser: 1.09 mg/dL (ref 0.76–1.27)
GFR calc Af Amer: 80 mL/min/{1.73_m2} (ref >59)
GFR calc non Af Amer: 69 mL/min/{1.73_m2} (ref >59)
GLUCOSE: 38 mg/dL — AB (ref 65–99)
Globulin, Total: 2.1 g/dL (ref 1.5–4.5)
Potassium: 4.6 mmol/L (ref 3.5–5.2)
Sodium: 141 mmol/L (ref 134–144)
TOTAL PROTEIN: 6.6 g/dL (ref 6.0–8.5)

## 2014-07-22 LAB — HM DIABETES EYE EXAM

## 2014-07-27 ENCOUNTER — Encounter: Payer: Self-pay | Admitting: Physical Medicine & Rehabilitation

## 2014-07-27 ENCOUNTER — Encounter: Payer: Medicare HMO | Attending: Physical Medicine & Rehabilitation | Admitting: Physical Medicine & Rehabilitation

## 2014-07-27 VITALS — BP 130/59 | HR 78 | Resp 16 | Ht 71.0 in | Wt 166.0 lb

## 2014-07-27 DIAGNOSIS — M19019 Primary osteoarthritis, unspecified shoulder: Secondary | ICD-10-CM | POA: Insufficient documentation

## 2014-07-27 DIAGNOSIS — M25559 Pain in unspecified hip: Secondary | ICD-10-CM | POA: Diagnosis present

## 2014-07-27 DIAGNOSIS — M24852 Other specific joint derangements of left hip, not elsewhere classified: Secondary | ICD-10-CM

## 2014-07-27 DIAGNOSIS — M25552 Pain in left hip: Secondary | ICD-10-CM

## 2014-07-27 DIAGNOSIS — M1610 Unilateral primary osteoarthritis, unspecified hip: Secondary | ICD-10-CM | POA: Insufficient documentation

## 2014-07-27 DIAGNOSIS — M19112 Post-traumatic osteoarthritis, left shoulder: Secondary | ICD-10-CM

## 2014-07-27 DIAGNOSIS — Z79899 Other long term (current) drug therapy: Secondary | ICD-10-CM | POA: Diagnosis present

## 2014-07-27 DIAGNOSIS — M719 Bursopathy, unspecified: Secondary | ICD-10-CM

## 2014-07-27 DIAGNOSIS — M19111 Post-traumatic osteoarthritis, right shoulder: Secondary | ICD-10-CM

## 2014-07-27 DIAGNOSIS — M160 Bilateral primary osteoarthritis of hip: Secondary | ICD-10-CM

## 2014-07-27 DIAGNOSIS — M19219 Secondary osteoarthritis, unspecified shoulder: Secondary | ICD-10-CM

## 2014-07-27 DIAGNOSIS — M7581 Other shoulder lesions, right shoulder: Secondary | ICD-10-CM

## 2014-07-27 DIAGNOSIS — M7582 Other shoulder lesions, left shoulder: Secondary | ICD-10-CM

## 2014-07-27 DIAGNOSIS — M25551 Pain in right hip: Secondary | ICD-10-CM

## 2014-07-27 DIAGNOSIS — Z5181 Encounter for therapeutic drug level monitoring: Secondary | ICD-10-CM | POA: Diagnosis present

## 2014-07-27 DIAGNOSIS — M169 Osteoarthritis of hip, unspecified: Secondary | ICD-10-CM | POA: Insufficient documentation

## 2014-07-27 DIAGNOSIS — R29898 Other symptoms and signs involving the musculoskeletal system: Secondary | ICD-10-CM | POA: Diagnosis not present

## 2014-07-27 DIAGNOSIS — M67919 Unspecified disorder of synovium and tendon, unspecified shoulder: Secondary | ICD-10-CM

## 2014-07-27 MED ORDER — METHOCARBAMOL 500 MG PO TABS: 500.0000 mg | ORAL_TABLET | Freq: Four times a day (QID) | ORAL | Status: DC | PRN

## 2014-07-27 MED ORDER — OXYCODONE-ACETAMINOPHEN 10-325 MG PO TABS: 1.0000 | ORAL_TABLET | Freq: Four times a day (QID) | ORAL | Status: DC | PRN

## 2014-07-27 NOTE — Progress Notes (Signed)
Subjective:    Patient ID: [name], male    DOB: March 04, 1946, 68 y.o.   MRN: [id number]  HPI  Mr. Colin is back regarding his chronic pain. His pain levels have been high. The pain meds aren't helping as much as they used to. He stays active gardening and in his yard. His neck often bothers him at night and when he's been busy during the day. His left hip bothers him a lot when he does a lot of walking. The left hip frequently hurts when he swings the leg forward. He is not icing any of the areas. He completed the meloxicam.  As far as his hip is concerned, the stretches didn't help a whole lot. He did them for 2 weeks.   He asks for stronger pain medication. He has not been compliant with all recommendations in past however including therapy recs.     Pain Inventory Average Pain 10 Pain Right Now 10 My pain is n/a  In the last 24 hours, has pain interfered with the following? General activity 9 Relation with others 9 Enjoyment of life 9 What TIME of day is your pain at its worst? n/a Sleep (in general) Fair  Pain is worse with: some activites Pain improves with: medication Relief from Meds: 5  Mobility ability to climb steps?  yes do you drive?  yes Do you have any goals in this area?  yes  Function not employed: date last employed .  Neuro/Psych No problems in this area  Prior Studies Any changes since last visit?  no  Physicians involved in your care Any changes since last visit?  no   Family History  Problem Relation Age of Onset  . Cancer Mother    History   Social History  . Marital Status: Widowed    Spouse Name: N/A    Number of Children: N/A  . Years of Education: N/A   Social History Main Topics  . Smoking status: Former Smoker -- 3.00 packs/day for 40 years    Types: Cigarettes    Quit date: 01/16/2010  . Smokeless tobacco: Never Used  . Alcohol Use: 7.2 oz/week    12 Cans of beer per week  . Drug Use: No  . Sexual Activity:  None   Other Topics Concern  . None   Social History Narrative  . None   Past Surgical History  Procedure Laterality Date  . Total hip arthroplasty      left hip  . Pr vein bypass graft,aorto-fem-pop  03-06-11    Right Common Femoral to BK popliteal BPG  . Hernia repair  1953  . Joint replacement     Past Medical History  Diagnosis Date  . Diabetes mellitus age 55    insulin dependent  . Arthritis   . Reflux   . Constipation 02/12/11  . Leg pain 02/12/11    with walking  . Joint pain 02/12/11    right shoulder  . Hypertension   . Anemia   . Peripheral vascular disease   . Anemia 01/07/2012  . History of tobacco abuse 01/07/2012  . Shortness of breath   . Pneumonia   . GERD (gastroesophageal reflux disease)    BP 130/59  Pulse 78  Resp 16  Ht 5\' 11"  (1.803 m)  Wt 166 lb (75.297 kg)  BMI 23.16 kg/m2  SpO2 100%  Opioid Risk Score:   Fall Risk Score: Moderate Fall Risk (6-13 points) (patient educated handout declined)  Review of Systems  Musculoskeletal: Positive for back pain.  All other systems reviewed and are negative.      Objective:   Physical Exam  General: Alert and oriented x 3, No apparent distress, doesn't appear in pain  HEENT: Head is normocephalic, atraumatic, PERRLA, EOMI, sclera anicteric, oral mucosa pink and moist, dentition intact, ext ear canals clear,  Neck: Supple without JVD or lymphadenopathy  Heart: Reg rate and rhythm. No murmurs rubs or gallops  Chest: CTA bilaterally without wheezes, rales, or rhonchi; no distress  Abdomen: Soft, non-tender, non-distended, bowel sounds positive.  Extremities: No clubbing, cyanosis, or edema. Feet are cool.  Skin: Clean and intact without signs of breakdown  Neuro: Pt is cognitively appropriate with normal insight, memory, and awareness. Cranial nerves 2-12 are intact. Sensory exam is normal except for distally over the toes on both feet. . Reflexes are 2+ in all 4's. Fine motor coordination is  intact. No tremors. Motor function is grossly 5/5.  Musculoskeletal: pain in bilateral shoulders, right more than left with IR/ER. +impingement maneuvers bilaterally. Biceps tendons only mildly tender. Cross arm maneuver equivocal. He decreased scapular mobility. Low back minimally tender with rom. He seemed to have more pain with bending than anything else. Paraspinals were a little tight. No gross asymmetries. Greater trochs were tender. But left hip pain was more proximal near the origination of the TFL. He had mild to moderate pain with patrick's test bilaterally, left more than right. Gait is wide based and he seems to step down into the left leg a bit. I measured leg length and there may have been an 1/8 discrepancy.  Psych: Pt's affect is appropriate. Pt is cooperative    Assessment & Plan:   1. 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 Hx of PVD s/p BPG--mild peripheral neuropathy  4. Left hip pain.    Plan:  1. Recommended ongoing stretching before activities and on a daily basis.  2. Will add robaxin prn for muscle spasm. 500mg  q6 prn for neck spasms/leg pain 3. Percocet refilled #120 today. I described to the patient that this is not a pain pill clinic. We will explore other options to treat pain before sorting to pure narcotic mgt  4. I palpated the ASIS on the left and migrated distally to point of maximal pain along the sartorious/rectus femoris. Then, after informed consent and preparation of the skin, I  Injected 3cc1% lidocaine and 6mg  celestone after insertion/withdrawal/aspiration to this area. Pt tolerated procedure without issue. Post-injection instructions were provided. 5. I or NP will see him back in about a month. 30 minutes of face to face patient care time were spent during this visit. All questions were encouraged and answered.

## 2014-07-27 NOTE — Patient Instructions (Addendum)
Recommend Ice three x daily to your hip/area----  Recommend continued, daily stretching of your pelvis and low back prior to your everyday outdoor activities.

## 2014-08-15 ENCOUNTER — Ambulatory Visit (INDEPENDENT_AMBULATORY_CARE_PROVIDER_SITE_OTHER): Payer: Medicare HMO

## 2014-08-15 DIAGNOSIS — Z23 Encounter for immunization: Secondary | ICD-10-CM

## 2014-08-24 ENCOUNTER — Encounter: Payer: Medicare HMO | Attending: Physical Medicine & Rehabilitation | Admitting: Registered Nurse

## 2014-08-24 ENCOUNTER — Encounter: Payer: Self-pay | Admitting: Registered Nurse

## 2014-08-24 VITALS — BP 137/60 | HR 81 | Resp 20

## 2014-08-24 DIAGNOSIS — M25552 Pain in left hip: Secondary | ICD-10-CM | POA: Insufficient documentation

## 2014-08-24 DIAGNOSIS — M7581 Other shoulder lesions, right shoulder: Secondary | ICD-10-CM | POA: Diagnosis present

## 2014-08-24 DIAGNOSIS — M25551 Pain in right hip: Secondary | ICD-10-CM | POA: Insufficient documentation

## 2014-08-24 DIAGNOSIS — M160 Bilateral primary osteoarthritis of hip: Secondary | ICD-10-CM

## 2014-08-24 DIAGNOSIS — M24852 Other specific joint derangements of left hip, not elsewhere classified: Secondary | ICD-10-CM | POA: Insufficient documentation

## 2014-08-24 DIAGNOSIS — M7582 Other shoulder lesions, left shoulder: Secondary | ICD-10-CM | POA: Insufficient documentation

## 2014-08-24 DIAGNOSIS — Z79899 Other long term (current) drug therapy: Secondary | ICD-10-CM

## 2014-08-24 DIAGNOSIS — Z5181 Encounter for therapeutic drug level monitoring: Secondary | ICD-10-CM

## 2014-08-24 MED ORDER — OXYCODONE-ACETAMINOPHEN 10-325 MG PO TABS: 1.0000 | ORAL_TABLET | Freq: Four times a day (QID) | ORAL | Status: DC | PRN

## 2014-08-24 NOTE — Progress Notes (Signed)
Subjective:    Patient ID: [name], male    DOB: 01/08/1946, 68 y.o.   MRN: [id number]  HPI: [name] is a 68 year old male who returns for follow up for chronic pain and medication refill. He says his pain is located in his neck, bilateral shoulder's and left hip. He rates his pain 9. His current exercise regime is performing stretching exercises and walking. Status post left hip injection with good relief noted. Pain Inventory Average Pain 9 Pain Right Now 9 My pain is not described  In the last 24 hours, has pain interfered with the following? General activity 10 Relation with others 10 Enjoyment of life 10 What TIME of day is your pain at its worst? n/a Sleep (in general) Poor  Pain is worse with: n/a Pain improves with: heat/ice, medication and injections Relief from Meds: 8  Mobility walk without assistance  Function Do you have any goals in this area?  no  Neuro/Psych No problems in this area  Prior Studies Any changes since last visit?  no  Physicians involved in your care Any changes since last visit?  no   Family History  Problem Relation Age of Onset  . Cancer Mother    History   Social History  . Marital Status: Widowed    Spouse Name: N/A    Number of Children: N/A  . Years of Education: N/A   Social History Main Topics  . Smoking status: Former Smoker -- 3.00 packs/day for 40 years    Types: Cigarettes    Quit date: 01/16/2010  . Smokeless tobacco: Never Used  . Alcohol Use: 7.2 oz/week    12 Cans of beer per week  . Drug Use: No  . Sexual Activity: None   Other Topics Concern  . None   Social History Narrative  . None   Past Surgical History  Procedure Laterality Date  . Total hip arthroplasty      left hip  . Pr vein bypass graft,aorto-fem-pop  03-06-11    Right Common Femoral to BK popliteal BPG  . Hernia repair  1953  . Joint replacement     Past Medical History  Diagnosis Date  . Diabetes mellitus age  77    insulin dependent  . Arthritis   . Reflux   . Constipation 02/12/11  . Leg pain 02/12/11    with walking  . Joint pain 02/12/11    right shoulder  . Hypertension   . Anemia   . Peripheral vascular disease   . Anemia 01/07/2012  . History of tobacco abuse 01/07/2012  . Shortness of breath   . Pneumonia   . GERD (gastroesophageal reflux disease)    BP 137/60  Pulse 81  Resp 20  SpO2 96%  Opioid Risk Score:   Fall Risk Score: Moderate Fall Risk (6-13 points) (educated and handout declined)  Review of Systems  All other systems reviewed and are negative.      Objective:   Physical Exam  Nursing note and vitals reviewed. Constitutional: He is oriented to person, place, and time. He appears well-developed and well-nourished.  HENT:  Head: Normocephalic and atraumatic.  Neck: Normal range of motion. Neck supple.  Cervical Paraspinal Tenderness: C-3- C-5  Cardiovascular: Normal rate and regular rhythm.   Pulmonary/Chest: Effort normal and breath sounds normal.  Musculoskeletal:  Normal Muscle Bulk and Muscle Testing Reveals: Upper Extremities: Full ROM and Muscle Strength 5/5 Bilateral AC Joint Tenderness Bilateral Spine  of Scapula Tenderness Back without spinal or Paraspinal Tenderness Lower Extremities: Full ROM and Muscle Strength 5/5 Left Leg Flexion Produces pain into Left Hip. Popping sound heard with extension ( Left Leg) Arises from chair with ease   Neurological: He is alert and oriented to person, place, and time.  Skin: Skin is warm and dry.  Psychiatric: He has a normal mood and affect.          Assessment & Plan:  1. 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 Complaing of Left Hip Pain Today:  Refilled: oxycodone 10/325mg  one tablet every 6 hours as needed #120.  2. 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   20 minutes  of face to face patient care time was spent during this visit. All questions were encouraged and answered.   F/U in 1 month

## 2014-09-06 ENCOUNTER — Telehealth: Payer: Self-pay | Admitting: Nurse Practitioner

## 2014-09-07 ENCOUNTER — Other Ambulatory Visit: Payer: Self-pay | Admitting: *Deleted

## 2014-09-07 MED ORDER — INSULIN PEN NEEDLE 31G X 6 MM MISC: Status: AC

## 2014-09-23 ENCOUNTER — Other Ambulatory Visit: Payer: Self-pay | Admitting: Physical Medicine & Rehabilitation

## 2014-09-23 ENCOUNTER — Encounter: Payer: Self-pay | Admitting: Physical Medicine & Rehabilitation

## 2014-09-23 ENCOUNTER — Encounter
Payer: Commercial Managed Care - HMO | Attending: Physical Medicine & Rehabilitation | Admitting: Physical Medicine & Rehabilitation

## 2014-09-23 VITALS — BP 146/66 | HR 72 | Resp 14 | Ht 71.0 in | Wt 164.0 lb

## 2014-09-23 DIAGNOSIS — M7582 Other shoulder lesions, left shoulder: Secondary | ICD-10-CM | POA: Insufficient documentation

## 2014-09-23 DIAGNOSIS — M7581 Other shoulder lesions, right shoulder: Secondary | ICD-10-CM | POA: Diagnosis present

## 2014-09-23 DIAGNOSIS — M19011 Primary osteoarthritis, right shoulder: Secondary | ICD-10-CM

## 2014-09-23 DIAGNOSIS — M25552 Pain in left hip: Secondary | ICD-10-CM | POA: Diagnosis present

## 2014-09-23 DIAGNOSIS — M19012 Primary osteoarthritis, left shoulder: Secondary | ICD-10-CM

## 2014-09-23 DIAGNOSIS — Z79899 Other long term (current) drug therapy: Secondary | ICD-10-CM

## 2014-09-23 DIAGNOSIS — Z5181 Encounter for therapeutic drug level monitoring: Secondary | ICD-10-CM

## 2014-09-23 DIAGNOSIS — M24852 Other specific joint derangements of left hip, not elsewhere classified: Secondary | ICD-10-CM | POA: Diagnosis present

## 2014-09-23 DIAGNOSIS — G894 Chronic pain syndrome: Secondary | ICD-10-CM

## 2014-09-23 DIAGNOSIS — M160 Bilateral primary osteoarthritis of hip: Secondary | ICD-10-CM

## 2014-09-23 DIAGNOSIS — M25551 Pain in right hip: Secondary | ICD-10-CM | POA: Insufficient documentation

## 2014-09-23 MED ORDER — OXYCODONE-ACETAMINOPHEN 10-325 MG PO TABS: 1.0000 | ORAL_TABLET | Freq: Four times a day (QID) | ORAL | Status: DC | PRN

## 2014-09-23 MED ORDER — BACLOFEN 10 MG PO TABS: 10.0000 mg | ORAL_TABLET | Freq: Three times a day (TID) | ORAL | Status: DC | PRN

## 2014-09-23 NOTE — Patient Instructions (Signed)
Pursue ice and stretching to your left hip on a regular basis

## 2014-09-23 NOTE — Progress Notes (Signed)
Subjective:    Patient ID: [name], male    DOB: 1946-09-10, 68 y.o.   MRN: [id number]  HPI   Mr. Cowsert is back regarding his chronic pain. He had 50% relief in his hip pain after the injection. The relief lasted for at least 2 weeks. At this point the pain is back to where it was before.  He is not icing it or stretching on a regular basis. He reports no other changes with his pain.   The percocet helps for breakthrough pain. He would like a different muscle relaxant which is covered by insurance.   Pain Inventory Average Pain 10 Pain Right Now 8 My pain is constant and stabbing  In the last 24 hours, has pain interfered with the following? General activity 9 Relation with others 9 Enjoyment of life 10 What TIME of day is your pain at its worst? ALL Sleep (in general) Poor  Pain is worse with: walking and some activites Pain improves with: heat/ice, medication and injections Relief from Meds: 6  Mobility walk without assistance how many minutes can you walk? 10 minutes ability to climb steps?  no do you drive?  yes  Function retired  Neuro/Psych trouble walking  Prior Studies Any changes since last visit?  no  Physicians involved in your care Any changes since last visit?  no   Family History  Problem Relation Age of Onset  . Cancer Mother    History   Social History  . Marital Status: Widowed    Spouse Name: N/A    Number of Children: N/A  . Years of Education: N/A   Social History Main Topics  . Smoking status: Former Smoker -- 3.00 packs/day for 40 years    Types: Cigarettes    Quit date: 01/16/2010  . Smokeless tobacco: Never Used  . Alcohol Use: 7.2 oz/week    12 Cans of beer per week  . Drug Use: No  . Sexual Activity: None   Other Topics Concern  . None   Social History Narrative   Past Surgical History  Procedure Laterality Date  . Total hip arthroplasty      left hip  . Pr vein bypass graft,aorto-fem-pop  03-06-11   Right Common Femoral to BK popliteal BPG  . Hernia repair  1953  . Joint replacement     Past Medical History  Diagnosis Date  . Diabetes mellitus age 89    insulin dependent  . Arthritis   . Reflux   . Constipation 02/12/11  . Leg pain 02/12/11    with walking  . Joint pain 02/12/11    right shoulder  . Hypertension   . Anemia   . Peripheral vascular disease   . Anemia 01/07/2012  . History of tobacco abuse 01/07/2012  . Shortness of breath   . Pneumonia   . GERD (gastroesophageal reflux disease)    BP 146/66 mmHg  Pulse 72  Resp 14  Ht 5\' 11"  (1.803 m)  Wt 164 lb (74.39 kg)  BMI 22.88 kg/m2  SpO2 99%  Opioid Risk Score:   Fall Risk Score: Low Fall Risk (0-5 points)  Review of Systems     Objective:   Physical Exam   General: Alert and oriented x 3, No apparent distress, doesn't appear in pain  HEENT: Head is normocephalic, atraumatic, PERRLA, EOMI, sclera anicteric, oral mucosa pink and moist, dentition intact, ext ear canals clear,  Neck: Supple without JVD or lymphadenopathy  Heart: Reg rate  and rhythm. No murmurs rubs or gallops  Chest: CTA bilaterally without wheezes, rales, or rhonchi; no distress  Abdomen: Soft, non-tender, non-distended, bowel sounds positive.  Extremities: No clubbing, cyanosis, or edema. Feet are cool.  Skin: Clean and intact without signs of breakdown  Neuro: Pt is cognitively appropriate with normal insight, memory, and awareness. Cranial nerves 2-12 are intact. Sensory exam is normal except for distally over the toes on both feet. . Reflexes are 2+ in all 4's. Fine motor coordination is intact. No tremors. Motor function is grossly 5/5.  Musculoskeletal: pain in bilateral shoulders, right more than left with IR/ER. +impingement maneuvers bilaterally. Biceps tendons only mildly tender. Cross arm maneuver equivocal. He decreased scapular mobility. Low back minimally tender with rom. He seemed to have more pain with bending than anything else.  Paraspinals were a little tight. No gross asymmetries. Greater trochs were tender.  He had mild to moderate pain with patrick's test bilaterally, left more than right. Gait is wide based and he seems to step down into the left leg a bit. I measured leg length  1/8 discrepancy. No pain with palpation of the rectus femoris/sartorius.    Psych: Pt's affect is appropriate. Pt is cooperative  Assessment & Plan:   1. 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 Hx of PVD s/p BPG--mild peripheral neuropathy  4. Left hip pain--mild rectus femoris tendonitis?/snapping hip   Plan:  1. Recommended ongoing stretching before activities and on a daily basis.  2. Will add flexeril  for muscle spasm and see if he has covg for this.  3. Percocet refilled #120 today.   4. Hold on further injection at this time. Recommend ic 5. I or NP will see him back in about a month. 30 minutes of face to face patient care time were spent during this visit. All questions were encouraged and answered.

## 2014-09-24 LAB — PMP ALCOHOL METABOLITE (ETG): Ethyl Glucuronide (EtG): NEGATIVE ng/mL

## 2014-09-28 LAB — OXYCODONE, URINE (LC/MS-MS)
NOROXYCODONE, UR: 356 ng/mL (ref <50)
Oxycodone, ur: 334 ng/mL (ref <50)
Oxymorphone: 337 ng/mL (ref <50)

## 2014-09-29 LAB — PRESCRIPTION MONITORING PROFILE (SOLSTAS)
AMPHETAMINE/METH: NEGATIVE ng/mL
BARBITURATE SCREEN, URINE: NEGATIVE ng/mL
BUPRENORPHINE, URINE: NEGATIVE ng/mL
Benzodiazepine Screen, Urine: NEGATIVE ng/mL
Cannabinoid Scrn, Ur: NEGATIVE ng/mL
Carisoprodol, Urine: NEGATIVE ng/mL
Cocaine Metabolites: NEGATIVE ng/mL
Creatinine, Urine: 37.92 mg/dL (ref >20.0)
FENTANYL URINE: NEGATIVE ng/mL
MDMA URINE: NEGATIVE ng/mL
MEPERIDINE UR: NEGATIVE ng/mL
Methadone Screen, Urine: NEGATIVE ng/mL
NITRITES URINE, INITIAL: NEGATIVE ug/mL
OPIATE SCREEN, URINE: NEGATIVE ng/mL
PROPOXYPHENE: NEGATIVE ng/mL
TAPENTADOLUR: NEGATIVE ng/mL
Tramadol Scrn, Ur: NEGATIVE ng/mL
Zolpidem, Urine: NEGATIVE ng/mL
pH, Initial: 6.3 pH (ref 4.5–8.9)

## 2014-10-11 ENCOUNTER — Other Ambulatory Visit: Payer: Self-pay | Admitting: Nurse Practitioner

## 2014-10-24 ENCOUNTER — Encounter: Payer: Self-pay | Admitting: Registered Nurse

## 2014-10-24 ENCOUNTER — Encounter: Payer: Medicare HMO | Attending: Physical Medicine & Rehabilitation | Admitting: Registered Nurse

## 2014-10-24 VITALS — BP 142/69 | HR 69 | Resp 14 | Wt 161.4 lb

## 2014-10-24 DIAGNOSIS — Z5181 Encounter for therapeutic drug level monitoring: Secondary | ICD-10-CM

## 2014-10-24 DIAGNOSIS — M25551 Pain in right hip: Secondary | ICD-10-CM | POA: Insufficient documentation

## 2014-10-24 DIAGNOSIS — M7582 Other shoulder lesions, left shoulder: Secondary | ICD-10-CM | POA: Diagnosis present

## 2014-10-24 DIAGNOSIS — M7581 Other shoulder lesions, right shoulder: Secondary | ICD-10-CM | POA: Insufficient documentation

## 2014-10-24 DIAGNOSIS — M19012 Primary osteoarthritis, left shoulder: Secondary | ICD-10-CM

## 2014-10-24 DIAGNOSIS — M160 Bilateral primary osteoarthritis of hip: Secondary | ICD-10-CM

## 2014-10-24 DIAGNOSIS — M24852 Other specific joint derangements of left hip, not elsewhere classified: Secondary | ICD-10-CM | POA: Insufficient documentation

## 2014-10-24 DIAGNOSIS — M19011 Primary osteoarthritis, right shoulder: Secondary | ICD-10-CM

## 2014-10-24 DIAGNOSIS — G894 Chronic pain syndrome: Secondary | ICD-10-CM

## 2014-10-24 DIAGNOSIS — M25552 Pain in left hip: Secondary | ICD-10-CM | POA: Diagnosis present

## 2014-10-24 DIAGNOSIS — Z79899 Other long term (current) drug therapy: Secondary | ICD-10-CM

## 2014-10-24 MED ORDER — OXYCODONE-ACETAMINOPHEN 10-325 MG PO TABS: 1.0000 | ORAL_TABLET | Freq: Four times a day (QID) | ORAL | Status: DC | PRN

## 2014-10-24 NOTE — Progress Notes (Signed)
Subjective:    Patient ID: [name], male    DOB: 1946/07/11, 68 y.o.   MRN: [id number]  HPI: [name] is a 68 year old male who returns for follow up for chronic pain and medication refill. He says his pain is located in his neck, bilateral shoulder's and left hip. He rates his pain 8. He's exercising occassionally has been encouraged to be consistent with his stretching exercises and walking daily. 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  Pain Inventory Average Pain 9 Pain Right Now 8 My pain is sharp, tingling and aching  In the last 24 hours, has pain interfered with the following? General activity 9 Relation with others 10 Enjoyment of life 10 What TIME of day is your pain at its worst? all Sleep (in general) Poor  Pain is worse with: walking, bending and standing Pain improves with: heat/ice and medication Relief from Meds: 5  Mobility walk without assistance  Function retired  Neuro/Psych trouble walking  Prior Studies Any changes since last visit?  no  Physicians involved in your care Any changes since last visit?  no   Family History  Problem Relation Age of Onset  . Cancer Mother    History   Social History  . Marital Status: Widowed    Spouse Name: N/A    Number of Children: N/A  . Years of Education: N/A   Social History Main Topics  . Smoking status: Former Smoker -- 3.00 packs/day for 40 years    Types: Cigarettes    Quit date: 01/16/2010  . Smokeless tobacco: Never Used  . Alcohol Use: 7.2 oz/week    12 Cans of beer per week  . Drug Use: No  . Sexual Activity: None   Other Topics Concern  . None   Social History Narrative   Past Surgical History  Procedure Laterality Date  . Total hip arthroplasty      left hip  . Pr vein bypass graft,aorto-fem-pop  03-06-11    Right Common Femoral to BK popliteal BPG  . Hernia repair  1953  . Joint replacement     Past Medical History    Diagnosis Date  . Diabetes mellitus age 57    insulin dependent  . Arthritis   . Reflux   . Constipation 02/12/11  . Leg pain 02/12/11    with walking  . Joint pain 02/12/11    right shoulder  . Hypertension   . Anemia   . Peripheral vascular disease   . Anemia 01/07/2012  . History of tobacco abuse 01/07/2012  . Shortness of breath   . Pneumonia   . GERD (gastroesophageal reflux disease)    BP 142/69 mmHg  Pulse 69  Resp 14  Wt 161 lb 6.4 oz (73.211 kg)  SpO2 96%  Opioid Risk Score:   Fall Risk Score: Moderate Fall Risk (6-13 points) (previoulsy educated and given handout)  Review of Systems  Musculoskeletal: Positive for gait problem.  All other systems reviewed and are negative.      Objective:   Physical Exam  Constitutional: He is oriented to person, place, and time. He appears well-developed and well-nourished.  HENT:  Head: Normocephalic and atraumatic.  Neck: Normal range of motion. Neck supple.  Cervical Paraspinal Tenderness: C-5- C-6  Cardiovascular: Normal rate and regular rhythm.   Pulmonary/Chest: Effort normal and breath sounds normal.  Musculoskeletal:  Normal Muscle Bulk and Muscle testing Reveals: Upper  extremities: Full ROM and Muscle strength 5/5 Thoracic Paraspinal Tenderness: T-1- T-3 Lower Extremities: Full ROM and Muscle strength 5/5 Arises from chair with ease Narrow Based Gait  Neurological: He is alert and oriented to person, place, and time.  Skin: Skin is warm and dry.  Psychiatric: He has a normal mood and affect.  Nursing note and vitals reviewed.         Assessment & Plan:  1. 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 Complaing of Left Hip Pain Today:  Refilled: oxycodone 10/325mg  one tablet every 6 hours as needed #120.  2. 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   20 minutes of face to face  patient care time was spent during this visit. All questions were encouraged and answered.   F/U in 1 month

## 2014-10-26 ENCOUNTER — Encounter (HOSPITAL_COMMUNITY): Payer: Self-pay | Admitting: Vascular Surgery

## 2014-10-31 ENCOUNTER — Encounter: Payer: Self-pay | Admitting: Nurse Practitioner

## 2014-10-31 ENCOUNTER — Ambulatory Visit (INDEPENDENT_AMBULATORY_CARE_PROVIDER_SITE_OTHER): Payer: Commercial Managed Care - HMO | Admitting: Nurse Practitioner

## 2014-10-31 DIAGNOSIS — I739 Peripheral vascular disease, unspecified: Secondary | ICD-10-CM

## 2014-10-31 DIAGNOSIS — E119 Type 2 diabetes mellitus without complications: Secondary | ICD-10-CM

## 2014-10-31 DIAGNOSIS — N5201 Erectile dysfunction due to arterial insufficiency: Secondary | ICD-10-CM

## 2014-10-31 DIAGNOSIS — M25551 Pain in right hip: Secondary | ICD-10-CM

## 2014-10-31 DIAGNOSIS — E785 Hyperlipidemia, unspecified: Secondary | ICD-10-CM

## 2014-10-31 DIAGNOSIS — I5032 Chronic diastolic (congestive) heart failure: Secondary | ICD-10-CM

## 2014-10-31 DIAGNOSIS — D509 Iron deficiency anemia, unspecified: Secondary | ICD-10-CM

## 2014-10-31 DIAGNOSIS — E11329 Type 2 diabetes mellitus with mild nonproliferative diabetic retinopathy without macular edema: Secondary | ICD-10-CM

## 2014-10-31 DIAGNOSIS — M25552 Pain in left hip: Secondary | ICD-10-CM

## 2014-10-31 DIAGNOSIS — M24852 Other specific joint derangements of left hip, not elsewhere classified: Secondary | ICD-10-CM

## 2014-10-31 DIAGNOSIS — E1165 Type 2 diabetes mellitus with hyperglycemia: Secondary | ICD-10-CM

## 2014-10-31 DIAGNOSIS — M7582 Other shoulder lesions, left shoulder: Secondary | ICD-10-CM

## 2014-10-31 DIAGNOSIS — M7581 Other shoulder lesions, right shoulder: Secondary | ICD-10-CM

## 2014-10-31 DIAGNOSIS — E113299 Type 2 diabetes mellitus with mild nonproliferative diabetic retinopathy without macular edema, unspecified eye: Secondary | ICD-10-CM

## 2014-10-31 DIAGNOSIS — G4700 Insomnia, unspecified: Secondary | ICD-10-CM

## 2014-10-31 DIAGNOSIS — K219 Gastro-esophageal reflux disease without esophagitis: Secondary | ICD-10-CM

## 2014-10-31 DIAGNOSIS — I10 Essential (primary) hypertension: Secondary | ICD-10-CM

## 2014-10-31 LAB — POCT GLYCOSYLATED HEMOGLOBIN (HGB A1C): HEMOGLOBIN A1C: 8.1

## 2014-10-31 MED ORDER — INSULIN GLARGINE 100 UNIT/ML SOLOSTAR PEN: 35.0000 [IU] | PEN_INJECTOR | Freq: Every day | SUBCUTANEOUS | Status: DC

## 2014-10-31 MED ORDER — SILDENAFIL CITRATE 100 MG PO TABS: 100.0000 mg | ORAL_TABLET | Freq: Every day | ORAL | Status: DC | PRN

## 2014-10-31 MED ORDER — METHOCARBAMOL 500 MG PO TABS: 500.0000 mg | ORAL_TABLET | Freq: Four times a day (QID) | ORAL | Status: DC | PRN

## 2014-10-31 NOTE — Patient Instructions (Signed)

## 2014-10-31 NOTE — Progress Notes (Signed)
Subjective:    Patient ID: [name], male    DOB: 1946/11/03, 68 y.o.   MRN: [id number]  Patient in today for follow up of chronic medical problems. No complaints today other than bllod sugars have been up and he has had to give hisself as much as 20 u of novolog before meals. Diabetes He presents for his follow-up diabetic visit. He has type 2 diabetes mellitus. No MedicAlert identification noted. His disease course has been stable. Pertinent negatives for hypoglycemia include no confusion or pallor. Pertinent negatives for diabetes include no chest pain, no polydipsia, no polyphagia, no polyuria, no visual change and no weakness. Diabetic complications include PVD. Risk factors for coronary artery disease include diabetes mellitus, dyslipidemia, hypertension and male sex. Current diabetic treatment includes insulin injections. He is compliant with treatment most of the time. His weight is stable. His breakfast blood glucose is taken between 8-9 am. His breakfast blood glucose range is generally >200 mg/dl. His overall blood glucose range is >200 mg/dl. An ACE inhibitor/angiotensin II receptor blocker is being taken. He does not see a podiatrist.Eye exam is not current.  Hypertension This is a chronic problem. The current episode started more than 1 year ago. The problem is controlled. Associated symptoms include neck pain. Pertinent negatives include no chest pain, palpitations or shortness of breath. Risk factors for coronary artery disease include diabetes mellitus, dyslipidemia and male gender. Past treatments include diuretics and ACE inhibitors. The current treatment provides moderate improvement. Compliance problems include diet and exercise.  Hypertensive end-organ damage includes PVD.  Anemia There has been no abdominal pain, confusion, light-headedness, pallor or palpitations.  Gastrophageal Reflux He reports no abdominal pain, no chest pain, no coughing or no sore throat.   Hyperlipidemia This is a chronic problem. The current episode started more than 1 year ago. The problem is uncontrolled. Recent lipid tests were reviewed and are high. Pertinent negatives include no chest pain or shortness of breath. He is currently on no antihyperlipidemic treatment. Risk factors for coronary artery disease include diabetes mellitus, dyslipidemia, hypertension and male sex.  PVD Plavix dialy- no bleeding that he is aware of . No longer sees PV surgeon Chronic back and left hip pain Seeing pain management in [facility] Dr Naaman Plummer currently and taking Percocet 10/325mg  1 po QID prn. Insomnia trazadone to sleep - works well- feels rested when he awakens.  Review of Systems  HENT: Negative for sore throat.   Respiratory: Negative for cough and shortness of breath.   Cardiovascular: Negative for chest pain and palpitations.  Gastrointestinal: Negative for abdominal pain.  Endocrine: Negative for polydipsia, polyphagia and polyuria.  Musculoskeletal: Positive for arthralgias and neck pain.  Skin: Negative for pallor.  Neurological: Negative for weakness and light-headedness.  Psychiatric/Behavioral: Negative for confusion.  All other systems reviewed and are negative.      Objective:   Physical Exam  Constitutional: He is oriented to person, place, and time. He appears well-developed and well-nourished.  HENT:  Head: Normocephalic.  Right Ear: External ear normal.  Left Ear: External ear normal.  Nose: Nose normal.  Mouth/Throat: Oropharynx is clear and moist.  Eyes: EOM are normal. Pupils are equal, round, and reactive to light.  Neck: Normal range of motion. Neck supple. No JVD present. No thyromegaly present.  Cardiovascular: Normal rate, regular rhythm, normal heart sounds and intact distal pulses.  Exam reveals no gallop and no friction rub.   No murmur heard. Pulmonary/Chest: Effort normal and breath sounds normal. No respiratory  distress. He has no wheezes. He  has no rales. He exhibits no tenderness.  Abdominal: Soft. Bowel sounds are normal. He exhibits no mass. There is no tenderness.  Musculoskeletal: Normal range of motion. He exhibits no edema.  Pain with movement on lumbar spine (-) SLR bil  Lymphadenopathy:    He has no cervical adenopathy.  Neurological: He is alert and oriented to person, place, and time. No cranial nerve deficit.  Skin: Skin is warm and dry.  Psychiatric: He has a normal mood and affect. His behavior is normal. Judgment and thought content normal.   BP 126/60 mmHg  Pulse 79  Temp(Src) 96.8 F (36 C) (Oral)  Ht 5\' 11"  (1.803 m)  Wt 162 lb (73.483 kg)  BMI 22.60 kg/m2     Results for orders placed or performed in visit on 10/31/14  POCT glycosylated hemoglobin (Hb A1C)  Result Value Ref Range   Hemoglobin A1C 8.1         Assessment & Plan:   1. Essential hypertension   2. Hyperlipidemia with target LDL less than 70   3. PVD (peripheral vascular disease)   4. Chronic diastolic congestive heart failure   5. Type 2 diabetes mellitus with hyperglycemia   6. Gastroesophageal reflux disease without esophagitis   7. Erectile dysfunction due to arterial insufficiency   8. Iron deficiency anemia   9. Insomnia   10. Mild nonproliferative diabetic retinopathy without macular edema associated with type 2 diabetes mellitus   11. 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 Bilateral hip pain    Meds ordered this encounter  Medications  . sildenafil (VIAGRA) 100 MG tablet    Sig: Take 1 tablet (100 mg total) by mouth daily as needed for erectile dysfunction.    Dispense:  3 tablet    Refill:  0    Order Specific Question:  Supervising Provider    Answer:  Chipper Herb [[id number]]  . methocarbamol (ROBAXIN) 500 MG tablet    Sig: Take 1 tablet (500 mg total) by mouth every 6 (six) hours as needed for muscle spasms.    Dispense:  75 tablet    Refill:  2    Order Specific Question:   Supervising Provider    Answer:  Chipper Herb [[id number]]  . Insulin Glargine (LANTUS SOLOSTAR) 100 UNIT/ML Solostar Pen    Sig: Inject 35 Units into the skin daily at 10 pm.    Dispense:  15 pen    Refill:  PRN    Order Specific Question:  Supervising Provider    Answer:  Chipper Herb [[id number]]   Wants [place] to take over his pain management Health maintenance reviewed Low fat diet and exercise RTO prn  Mary-Margaret Hassell Done, FNP

## 2014-11-01 LAB — CMP14+EGFR
ALT: 7 IU/L (ref 0–44)
AST: 8 IU/L (ref 0–40)
Albumin/Globulin Ratio: 1.8 (ref 1.1–2.5)
Albumin: 4 g/dL (ref 3.6–4.8)
Alkaline Phosphatase: 93 IU/L (ref 39–117)
BILIRUBIN TOTAL: 0.3 mg/dL (ref 0.0–1.2)
BUN/Creatinine Ratio: 15 (ref 10–22)
BUN: 12 mg/dL (ref 8–27)
CO2: 26 mmol/L (ref 18–29)
CREATININE: 0.8 mg/dL (ref 0.76–1.27)
Calcium: 8.9 mg/dL (ref 8.6–10.2)
Chloride: 99 mmol/L (ref 97–108)
GFR calc non Af Amer: 91 mL/min/{1.73_m2} (ref >59)
GFR, EST AFRICAN AMERICAN: 105 mL/min/{1.73_m2} (ref >59)
Globulin, Total: 2.2 g/dL (ref 1.5–4.5)
Glucose: 207 mg/dL — ABNORMAL HIGH (ref 65–99)
POTASSIUM: 4 mmol/L (ref 3.5–5.2)
SODIUM: 138 mmol/L (ref 134–144)
TOTAL PROTEIN: 6.2 g/dL (ref 6.0–8.5)

## 2014-11-01 LAB — NMR, LIPOPROFILE
CHOLESTEROL: 163 mg/dL (ref 100–199)
HDL Cholesterol by NMR: 56 mg/dL (ref >39)
HDL Particle Number: 29 umol/L — ABNORMAL LOW (ref >=30.5)
LDL Particle Number: 853 nmol/L (ref <1000)
LDL SIZE: 21.2 nm (ref >20.5)
LDL-C: 77 mg/dL (ref 0–99)
LP-IR Score: 27 (ref <=45)
SMALL LDL PARTICLE NUMBER: 288 nmol/L (ref <=527)
Triglycerides by NMR: 150 mg/dL — ABNORMAL HIGH (ref 0–149)

## 2014-11-23 ENCOUNTER — Telehealth: Payer: Self-pay | Admitting: Nurse Practitioner

## 2014-11-23 NOTE — Telephone Encounter (Signed)
Only if planning on staying on current meds without changes

## 2014-11-23 NOTE — Telephone Encounter (Signed)
Patient was referred to pain clinic in 12-2013.

## 2014-11-24 NOTE — Telephone Encounter (Signed)
Patient is established at pain clinic.  He gets oxycodone and injections for pain relief.  His reason for wanting to come back to our office for this medication is to save money on co-pays.  He comes to [place] for check ups and labwork .  He  does not want a referral to orthopedics to help with his hip pain ,(surgery was done about 16 years earlier).  He is still in constant pain.  It was explained how difficult it would be to try to get him back into another pain clinic if prescribing his medicine became an issue.

## 2014-11-25 ENCOUNTER — Encounter: Payer: Commercial Managed Care - HMO | Attending: Physical Medicine & Rehabilitation | Admitting: Registered Nurse

## 2014-11-25 ENCOUNTER — Encounter: Payer: Self-pay | Admitting: Registered Nurse

## 2014-11-25 VITALS — BP 181/64 | HR 73 | Resp 14

## 2014-11-25 DIAGNOSIS — M160 Bilateral primary osteoarthritis of hip: Secondary | ICD-10-CM

## 2014-11-25 DIAGNOSIS — M25551 Pain in right hip: Secondary | ICD-10-CM | POA: Insufficient documentation

## 2014-11-25 DIAGNOSIS — Z5181 Encounter for therapeutic drug level monitoring: Secondary | ICD-10-CM

## 2014-11-25 DIAGNOSIS — M7582 Other shoulder lesions, left shoulder: Secondary | ICD-10-CM | POA: Diagnosis present

## 2014-11-25 DIAGNOSIS — M7581 Other shoulder lesions, right shoulder: Secondary | ICD-10-CM | POA: Diagnosis present

## 2014-11-25 DIAGNOSIS — G894 Chronic pain syndrome: Secondary | ICD-10-CM

## 2014-11-25 DIAGNOSIS — M19011 Primary osteoarthritis, right shoulder: Secondary | ICD-10-CM

## 2014-11-25 DIAGNOSIS — M25552 Pain in left hip: Secondary | ICD-10-CM | POA: Diagnosis present

## 2014-11-25 DIAGNOSIS — Z79899 Other long term (current) drug therapy: Secondary | ICD-10-CM

## 2014-11-25 DIAGNOSIS — M24852 Other specific joint derangements of left hip, not elsewhere classified: Secondary | ICD-10-CM | POA: Diagnosis present

## 2014-11-25 DIAGNOSIS — M19012 Primary osteoarthritis, left shoulder: Secondary | ICD-10-CM

## 2014-11-25 MED ORDER — OXYCODONE-ACETAMINOPHEN 10-325 MG PO TABS: 1.0000 | ORAL_TABLET | Freq: Four times a day (QID) | ORAL | Status: DC | PRN

## 2014-11-25 NOTE — Progress Notes (Signed)
Urine drug screen for this encounter is consistent for prescribed medications.   

## 2014-11-25 NOTE — Progress Notes (Addendum)
Subjective:    Patient ID: [name], male    DOB: 04-17-1946, 69 y.o.   MRN: [id number]  HPI: [name] is a 69 year old male who returns for follow up for chronic pain and medication refill. He says his pain is located in his neck, bilateral shoulder's and left hip. He rates his pain 9. His current exercise regime is performing stretching exercises and walking daily. [name] brought in his bill from the office total cost was 540.00 he has a balance of 250.00, he says he is on a fixed income can't afford to come to the office monthly. He spoke to his PCP to see if they could prescribed his analgesics, they won't prescribed. We discuss different analgesics he will think about it. His UDS have been consistent, I will allow him to return in two month's this time. I will discuss with Dr. Naaman Barrera if this can be continued he verbalizes understanding.  Arrived to office hypertensive 180/64 blood pressure re-checked 170/80. He says he is compliant with his anti-hypertensive's.   Spoke with Dr. Naaman Barrera he will allow Mr. Downard to come every two months. Pain Inventory Average Pain 10 Pain Right Now 9 My pain is burning  In the last 24 hours, has pain interfered with the following? General activity 10 Relation with others 9 Enjoyment of life 10 What TIME of day is your pain at its worst? morning, daytime, evening Sleep (in general) Poor  Pain is worse with: unsure Pain improves with: heat/ice and medication Relief from Meds: 3  Mobility walk without assistance how many minutes can you walk? 10 do you drive?  yes  Function retired  Neuro/Psych No problems in this area  Prior Studies Any changes since last visit?  no  Physicians involved in your care Any changes since last visit?  no   Family History  Problem Relation Age of Onset  . Cancer Mother    History   Social History  . Marital Status: Widowed    Spouse Name: N/A    Number of Children: N/A  .  Years of Education: N/A   Social History Main Topics  . Smoking status: Former Smoker -- 3.00 packs/day for 40 years    Types: Cigarettes    Quit date: 01/16/2010  . Smokeless tobacco: Never Used  . Alcohol Use: 7.2 oz/week    12 Cans of beer per week  . Drug Use: No  . Sexual Activity: None   Other Topics Concern  . None   Social History Narrative   Past Surgical History  Procedure Laterality Date  . Total hip arthroplasty      left hip  . Pr vein bypass graft,aorto-fem-pop  03-06-11    Right Common Femoral to BK popliteal BPG  . Hernia repair  1953  . Joint replacement    . Lower extremity angiogram Right 10/17/2011    Procedure: LOWER EXTREMITY ANGIOGRAM;  Surgeon: Conrad [place], MD;  Location: [facility] CATH LAB;  Service: Cardiovascular;  Laterality: Right;  . Abdominal angiogram N/A 10/17/2011    Procedure: ABDOMINAL ANGIOGRAM;  Surgeon: Conrad [place], MD;  Location: [facility] CATH LAB;  Service: Cardiovascular;  Laterality: N/A;   Past Medical History  Diagnosis Date  . Diabetes mellitus age 77    insulin dependent  . Arthritis   . Reflux   . Constipation 02/12/11  . Leg pain 02/12/11    with walking  . Joint pain 02/12/11    right shoulder  .  Hypertension   . Anemia   . Peripheral vascular disease   . Anemia 01/07/2012  . History of tobacco abuse 01/07/2012  . Shortness of breath   . Pneumonia   . GERD (gastroesophageal reflux disease)    BP 181/64 mmHg  Pulse 73  Resp 14  SpO2 98%  Opioid Risk Score:   Fall Risk Score: Low Fall Risk (0-5 points) Review of Systems  All other systems reviewed and are negative.      Objective:   Physical Exam  Constitutional: He is oriented to person, place, and time. He appears well-developed and well-nourished.  HENT:  Head: Normocephalic and atraumatic.  Neck: Normal range of motion. Neck supple.  Cardiovascular: Normal rate and regular rhythm.   Pulmonary/Chest: Effort normal and breath sounds normal.  Musculoskeletal:    Normal Muscle Bulk and Muscle Testing Reveals: Upper extremities: Full ROM and Muscle strength 5/5 Back without spinal or Paraspinal tenderness Noted Lower extremities : Full ROM and Muscle strength 5/5 Arises from chair with ease Narrow Based gait   Neurological: He is alert and oriented to person, place, and time.  Skin: Skin is warm and dry.  Psychiatric: He has a normal mood and affect.  Nursing note and vitals reviewed.         Assessment & Plan:  1. 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 Complaing of Left Hip Pain Today:  Refilled: oxycodone 10/325mg  one tablet every 6 hours as needed #120.  2. 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   20 minutes of face to face patient care time was spent during this visit. All questions were encouraged and answered.   F/U in 2 months  F/U in 1 month

## 2014-11-28 ENCOUNTER — Other Ambulatory Visit: Payer: Self-pay | Admitting: Nurse Practitioner

## 2015-01-08 ENCOUNTER — Encounter: Payer: Self-pay | Admitting: Physical Medicine & Rehabilitation

## 2015-01-08 ENCOUNTER — Other Ambulatory Visit: Payer: Self-pay | Admitting: Nurse Practitioner

## 2015-01-08 NOTE — Progress Notes (Signed)
No show

## 2015-01-09 NOTE — Telephone Encounter (Signed)
Last seen 10/31/14 MMM

## 2015-01-20 ENCOUNTER — Encounter: Payer: Commercial Managed Care - HMO | Attending: Physical Medicine & Rehabilitation | Admitting: Registered Nurse

## 2015-01-20 ENCOUNTER — Encounter: Payer: Self-pay | Admitting: Registered Nurse

## 2015-01-20 VITALS — BP 124/63 | HR 78 | Resp 14

## 2015-01-20 DIAGNOSIS — M25552 Pain in left hip: Secondary | ICD-10-CM | POA: Diagnosis present

## 2015-01-20 DIAGNOSIS — M19012 Primary osteoarthritis, left shoulder: Secondary | ICD-10-CM

## 2015-01-20 DIAGNOSIS — M7581 Other shoulder lesions, right shoulder: Secondary | ICD-10-CM | POA: Insufficient documentation

## 2015-01-20 DIAGNOSIS — M24852 Other specific joint derangements of left hip, not elsewhere classified: Secondary | ICD-10-CM | POA: Insufficient documentation

## 2015-01-20 DIAGNOSIS — M7582 Other shoulder lesions, left shoulder: Secondary | ICD-10-CM | POA: Insufficient documentation

## 2015-01-20 DIAGNOSIS — M160 Bilateral primary osteoarthritis of hip: Secondary | ICD-10-CM

## 2015-01-20 DIAGNOSIS — M25551 Pain in right hip: Secondary | ICD-10-CM | POA: Insufficient documentation

## 2015-01-20 DIAGNOSIS — Z79899 Other long term (current) drug therapy: Secondary | ICD-10-CM

## 2015-01-20 DIAGNOSIS — G894 Chronic pain syndrome: Secondary | ICD-10-CM

## 2015-01-20 DIAGNOSIS — M19011 Primary osteoarthritis, right shoulder: Secondary | ICD-10-CM

## 2015-01-20 DIAGNOSIS — Z5181 Encounter for therapeutic drug level monitoring: Secondary | ICD-10-CM

## 2015-01-20 MED ORDER — OXYCODONE-ACETAMINOPHEN 10-325 MG PO TABS: 1.0000 | ORAL_TABLET | Freq: Four times a day (QID) | ORAL | Status: DC | PRN

## 2015-01-20 MED ORDER — BACLOFEN 10 MG PO TABS: 10.0000 mg | ORAL_TABLET | Freq: Three times a day (TID) | ORAL | Status: AC | PRN

## 2015-01-20 NOTE — Progress Notes (Signed)
Subjective:    Patient ID: [name], male    DOB: 27-Aug-1946, 69 y.o.   MRN: [id number]  HPI: [name] is a 69 year old male who returns for follow up for chronic pain and medication refill. He says his pain is located in his bilateral shoulder's and left hip. He rates his pain 7. His current exercise regime is performing stretching exercises and walking daily.  Pain Inventory Average Pain 8 Pain Right Now 7 My pain is sharp  In the last 24 hours, has pain interfered with the following? General activity 10 Relation with others 10 Enjoyment of life 10 What TIME of day is your pain at its worst? varies Sleep (in general) Poor  Pain is worse with: walking Pain improves with: medication Relief from Meds: 7  Mobility how many minutes can you walk? 5-10 ability to climb steps?  yes do you drive?  yes Do you have any goals in this area?  yes  Function not employed: date last employed .  Neuro/Psych No problems in this area  Prior Studies Any changes since last visit?  no  Physicians involved in your care Any changes since last visit?  no   Family History  Problem Relation Age of Onset  . Cancer Mother    History   Social History  . Marital Status: Widowed    Spouse Name: N/A  . Number of Children: N/A  . Years of Education: N/A   Social History Main Topics  . Smoking status: Former Smoker -- 3.00 packs/day for 40 years    Types: Cigarettes    Quit date: 01/16/2010  . Smokeless tobacco: Never Used  . Alcohol Use: 7.2 oz/week    12 Cans of beer per week  . Drug Use: No  . Sexual Activity: Not on file   Other Topics Concern  . None   Social History Narrative   Past Surgical History  Procedure Laterality Date  . Total hip arthroplasty      left hip  . Pr vein bypass graft,aorto-fem-pop  03-06-11    Right Common Femoral to BK popliteal BPG  . Hernia repair  1953  . Joint replacement    . Lower extremity angiogram Right 10/17/2011   Procedure: LOWER EXTREMITY ANGIOGRAM;  Surgeon: Conrad [place], MD;  Location: [facility] CATH LAB;  Service: Cardiovascular;  Laterality: Right;  . Abdominal angiogram N/A 10/17/2011    Procedure: ABDOMINAL ANGIOGRAM;  Surgeon: Conrad [place], MD;  Location: [facility] CATH LAB;  Service: Cardiovascular;  Laterality: N/A;   Past Medical History  Diagnosis Date  . Diabetes mellitus age 37    insulin dependent  . Arthritis   . Reflux   . Constipation 02/12/11  . Leg pain 02/12/11    with walking  . Joint pain 02/12/11    right shoulder  . Hypertension   . Anemia   . Peripheral vascular disease   . Anemia 01/07/2012  . History of tobacco abuse 01/07/2012  . Shortness of breath   . Pneumonia   . GERD (gastroesophageal reflux disease)    BP 124/63 mmHg  Pulse 78  Resp 14  SpO2 97%  Opioid Risk Score:   Fall Risk Score: Moderate Fall Risk (6-13 points) (patient declined pamphlet during today's visit)  Review of Systems  Endocrine:       High blood sugar Low blood sugar   All other systems reviewed and are negative.      Objective:  Physical Exam  Constitutional: He is oriented to person, place, and time. He appears well-developed and well-nourished.  HENT:  Head: Normocephalic and atraumatic.  Neck: Normal range of motion. Neck supple.  Cardiovascular: Normal rate and regular rhythm.   Pulmonary/Chest: Effort normal and breath sounds normal.  Musculoskeletal:  Normal Muscle Bulk and Muscle Testing Reveals: Upper Extremities: Full ROM and muscle strength 5/5 Lower Extremities: Full ROM and Muscle strength 5/5 Left Lower extremity Flexion Produces pain into Left Hip Arises from chair with ease Narrow Based gait  Neurological: He is alert and oriented to person, place, and time.  Skin: Skin is warm and dry.  Psychiatric: He has a normal mood and affect.  Nursing note and vitals reviewed.         Assessment & Plan:  1. 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 Complaing of Left Hip Pain Today:  Refilled: oxycodone 10/325mg  one tablet every 6 hours as needed #120. Second script given. 2. 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  3. Muscle Spasms: Continue baclofen 20 minutes of face to face patient care time was spent during this visit. All questions were encouraged and answered.   F/U in 2 months

## 2015-02-03 ENCOUNTER — Other Ambulatory Visit: Payer: Self-pay | Admitting: Nurse Practitioner

## 2015-02-06 ENCOUNTER — Ambulatory Visit (INDEPENDENT_AMBULATORY_CARE_PROVIDER_SITE_OTHER): Payer: Commercial Managed Care - HMO | Admitting: Nurse Practitioner

## 2015-02-06 ENCOUNTER — Ambulatory Visit (INDEPENDENT_AMBULATORY_CARE_PROVIDER_SITE_OTHER): Payer: Commercial Managed Care - HMO

## 2015-02-06 ENCOUNTER — Encounter: Payer: Self-pay | Admitting: Nurse Practitioner

## 2015-02-06 VITALS — BP 110/60 | HR 84 | Temp 97.7°F | Wt 160.4 lb

## 2015-02-06 DIAGNOSIS — Z720 Tobacco use: Secondary | ICD-10-CM | POA: Diagnosis not present

## 2015-02-06 DIAGNOSIS — K219 Gastro-esophageal reflux disease without esophagitis: Secondary | ICD-10-CM

## 2015-02-06 DIAGNOSIS — G4700 Insomnia, unspecified: Secondary | ICD-10-CM | POA: Diagnosis not present

## 2015-02-06 DIAGNOSIS — I10 Essential (primary) hypertension: Secondary | ICD-10-CM | POA: Diagnosis not present

## 2015-02-06 DIAGNOSIS — I70219 Atherosclerosis of native arteries of extremities with intermittent claudication, unspecified extremity: Secondary | ICD-10-CM

## 2015-02-06 DIAGNOSIS — E1165 Type 2 diabetes mellitus with hyperglycemia: Secondary | ICD-10-CM

## 2015-02-06 DIAGNOSIS — I739 Peripheral vascular disease, unspecified: Secondary | ICD-10-CM | POA: Diagnosis not present

## 2015-02-06 DIAGNOSIS — E11329 Type 2 diabetes mellitus with mild nonproliferative diabetic retinopathy without macular edema: Secondary | ICD-10-CM

## 2015-02-06 DIAGNOSIS — Z87891 Personal history of nicotine dependence: Secondary | ICD-10-CM

## 2015-02-06 DIAGNOSIS — E113299 Type 2 diabetes mellitus with mild nonproliferative diabetic retinopathy without macular edema, unspecified eye: Secondary | ICD-10-CM

## 2015-02-06 DIAGNOSIS — E291 Testicular hypofunction: Secondary | ICD-10-CM

## 2015-02-06 DIAGNOSIS — E785 Hyperlipidemia, unspecified: Secondary | ICD-10-CM | POA: Diagnosis not present

## 2015-02-06 LAB — POCT GLYCOSYLATED HEMOGLOBIN (HGB A1C): HEMOGLOBIN A1C: 7.6

## 2015-02-06 MED ORDER — INSULIN GLARGINE 100 UNIT/ML ~~LOC~~ SOLN: 35.0000 [IU] | Freq: Every day | SUBCUTANEOUS | Status: DC

## 2015-02-06 MED ORDER — FUROSEMIDE 20 MG PO TABS: 20.0000 mg | ORAL_TABLET | Freq: Every day | ORAL | Status: DC

## 2015-02-06 MED ORDER — LISINOPRIL 5 MG PO TABS
5.0000 mg | ORAL_TABLET | Freq: Every day | ORAL | Status: DC
Start: 2015-02-06 — End: 2015-08-30

## 2015-02-06 MED ORDER — TESTOSTERONE 40.5 MG/2.5GM (1.62%) TD GEL: 2.0000 | Freq: Every day | TRANSDERMAL | Status: DC

## 2015-02-06 MED ORDER — CLOPIDOGREL BISULFATE 75 MG PO TABS: 75.0000 mg | ORAL_TABLET | Freq: Every day | ORAL | Status: DC

## 2015-02-06 MED ORDER — OMEPRAZOLE 40 MG PO CPDR: 40.0000 mg | DELAYED_RELEASE_CAPSULE | Freq: Every day | ORAL | Status: DC

## 2015-02-06 MED ORDER — TRAZODONE HCL 50 MG PO TABS: ORAL_TABLET | ORAL | Status: DC

## 2015-02-06 MED ORDER — INSULIN ASPART 100 UNIT/ML ~~LOC~~ SOLN: SUBCUTANEOUS | Status: DC

## 2015-02-06 NOTE — Progress Notes (Signed)
Subjective:    Patient ID: [name], male    DOB: March 03, 1946, 69 y.o.   MRN: [id number]  HPI patient is here today for chronic disease follow up. No acute complaint.   Diabetes He presents for his follow-up diabetic visit. He has type 2 diabetes mellitus. No MedicAlert identification noted. The initial diagnosis of diabetes was made 30 years ago. His disease course has been stable. There are no hypoglycemic associated symptoms. Pertinent negatives for hypoglycemia include no confusion or pallor. Pertinent negatives for diabetes include no chest pain, no foot paresthesias, no foot ulcerations, no polydipsia, no polyphagia, no polyuria, no visual change, no weakness and no weight loss. There are no hypoglycemic complications. Symptoms are stable. Diabetic complications include heart disease, impotence and peripheral neuropathy (mild). Risk factors for coronary artery disease include male sex, dyslipidemia and hypertension. Current diabetic treatment includes insulin injections (averages 12u on sliding scale with meals.). He is compliant with treatment all of the time. Insulin dose schedule: lantus in AM, and sliding scale novolog at meals. Rotation sites for injection include the abdominal wall. His weight is stable. When asked about meal planning, he reported none. He has not had a previous visit with a dietician. He rarely participates in exercise. His home blood glucose trend is fluctuating dramatically. His breakfast blood glucose is taken between 9-10 am. His breakfast blood glucose range is generally 130-140 mg/dl. His highest blood glucose is >200 mg/dl. His overall blood glucose range is 180-200 mg/dl. (Blood sugar range from low 100's to low 400's) An ACE inhibitor/angiotensin II receptor blocker is being taken. He does not see a podiatrist.Eye exam is current (06/2012).  Hypertension This is a chronic problem. The current episode started more than 1 year ago. The problem is unchanged. The  problem is controlled. Associated symptoms include neck pain. Pertinent negatives include no chest pain, orthopnea, palpitations, peripheral edema or shortness of breath. There are no associated agents to hypertension. Risk factors for coronary artery disease include diabetes mellitus, dyslipidemia and male gender. Past treatments include ACE inhibitors. The current treatment provides significant improvement. Compliance problems include diet and exercise.   Anemia Presents for follow-up (Last hgb was .13) visit. There has been no abdominal pain, confusion, leg swelling, light-headedness, pallor, palpitations or weight loss. Procedure history includes EGD (years ago). Compliance problems include adherence to diet.  Compliance with medications is 76-100%. Compliance with diet is 26-50%. Side effects of medications include auditory problems.  Gastrophageal Reflux He reports no abdominal pain, no chest pain, no coughing, no dysphagia, no heartburn or no sore throat. This is a chronic problem. The current episode started more than 1 year ago. The problem occurs rarely. The problem has been rapidly improving. Nothing aggravates the symptoms. Associated symptoms include anemia. Pertinent negatives include no weight loss. Risk factors include ETOH use. He has tried a PPI for the symptoms. The treatment provided moderate relief. Past procedures include an EGD (years ago).  Hyperlipidemia This is a chronic problem. The current episode started more than 1 year ago. The problem is controlled. Recent lipid tests were reviewed and are low. Exacerbating diseases include diabetes. Pertinent negatives include no chest pain or shortness of breath. He is currently on no antihyperlipidemic treatment. The current treatment provides significant improvement of lipids. There are no compliance problems.  Risk factors for coronary artery disease include diabetes mellitus, hypertension and male sex.  PVD Plavix dialy- no bleeding that  he is aware of . No longer sees PV surgeon Chronic  back and left hip pain Seeing pain management in [facility] Dr Naaman Plummer currently and taking Percocet 10/366m 1 po QID prn.     Review of Systems  Constitutional: Negative.   HENT: Negative.   Eyes: Negative.   Respiratory: Negative.   Cardiovascular: Negative.   Gastrointestinal: Negative.   Endocrine: Negative.   Genitourinary: Negative.   Musculoskeletal: Negative.   Skin: Negative.   Allergic/Immunologic: Negative.   Neurological: Negative.   Hematological: Negative.   Psychiatric/Behavioral: Negative.        Objective:   Physical Exam  Constitutional: He appears well-developed.  HENT:  Head: Normocephalic.  Right ear cerumen impaction.   Eyes: Pupils are equal, round, and reactive to light.  Neck: Normal range of motion.  Cardiovascular: Normal rate.   Pulmonary/Chest: Effort normal.  Abdominal: Soft.  Musculoskeletal: Normal range of motion.  Neurological: He is alert.  Skin: Skin is warm.  Psychiatric: He has a normal mood and affect. His behavior is normal. Thought content normal.    BP 110/60 mmHg  Pulse 84  Temp(Src) 97.7 F (36.5 C) (Oral)  Wt 160 lb 6.4 oz (72.757 kg)  Results for orders placed or performed in visit on 02/06/15  POCT glycosylated hemoglobin (Hb A1C)  Result Value Ref Range   Hemoglobin A1C 7.6%    Chest x ray- Chronic bronchitic changes- no changes from previous-Preliminary reading by MRonnald Collum FNP  [facility]-Mary-Margaret MHassell Done FNP'    Assessment & Plan:  1. Hyperlipidemia with target LDL less than 70 Low fat diet - NMR, lipoprofile  2. Essential hypertension Low salt diet - CMP14+EGFR  3. Type 2 diabetes mellitus with hyperglycemia Carb count Moderate exercise encouraged.  - POCT glycosylated hemoglobin (Hb A1C)    Labs pending Health maintenance reviewed Diet and exercise encouraged Continue all meds Follow up  In 3 months.    Mary-Margaret MHassell Done  FNP

## 2015-02-06 NOTE — Patient Instructions (Signed)

## 2015-02-07 ENCOUNTER — Other Ambulatory Visit: Payer: Self-pay | Admitting: *Deleted

## 2015-02-07 DIAGNOSIS — E291 Testicular hypofunction: Secondary | ICD-10-CM

## 2015-02-07 LAB — NMR, LIPOPROFILE
Cholesterol: 169 mg/dL (ref 100–199)
HDL Cholesterol by NMR: 73 mg/dL (ref >39)
HDL Particle Number: 40.3 umol/L (ref >=30.5)
LDL PARTICLE NUMBER: 763 nmol/L (ref <1000)
LDL SIZE: 20.6 nm (ref >20.5)
LDL-C: 75 mg/dL (ref 0–99)
LP-IR SCORE: 25 (ref <=45)
Small LDL Particle Number: 314 nmol/L (ref <=527)
Triglycerides by NMR: 103 mg/dL (ref 0–149)

## 2015-02-07 LAB — CMP14+EGFR
A/G RATIO: 2 (ref 1.1–2.5)
ALT: 8 IU/L (ref 0–44)
AST: 12 IU/L (ref 0–40)
Albumin: 4.3 g/dL (ref 3.6–4.8)
Alkaline Phosphatase: 83 IU/L (ref 39–117)
BILIRUBIN TOTAL: 0.3 mg/dL (ref 0.0–1.2)
BUN/Creatinine Ratio: 17 (ref 10–22)
BUN: 14 mg/dL (ref 8–27)
CO2: 25 mmol/L (ref 18–29)
Calcium: 9.8 mg/dL (ref 8.6–10.2)
Chloride: 103 mmol/L (ref 97–108)
Creatinine, Ser: 0.83 mg/dL (ref 0.76–1.27)
GFR calc non Af Amer: 90 mL/min/{1.73_m2} (ref >59)
GFR, EST AFRICAN AMERICAN: 104 mL/min/{1.73_m2} (ref >59)
Globulin, Total: 2.2 g/dL (ref 1.5–4.5)
Glucose: 93 mg/dL (ref 65–99)
POTASSIUM: 4.3 mmol/L (ref 3.5–5.2)
Sodium: 141 mmol/L (ref 134–144)
Total Protein: 6.5 g/dL (ref 6.0–8.5)

## 2015-02-07 MED ORDER — TESTOSTERONE 40.5 MG/2.5GM (1.62%) TD GEL: 2.0000 | Freq: Every day | TRANSDERMAL | Status: DC

## 2015-03-07 ENCOUNTER — Other Ambulatory Visit: Payer: Self-pay | Admitting: Nurse Practitioner

## 2015-03-22 ENCOUNTER — Other Ambulatory Visit: Payer: Self-pay | Admitting: Physical Medicine & Rehabilitation

## 2015-03-22 ENCOUNTER — Encounter: Payer: Self-pay | Admitting: Physical Medicine & Rehabilitation

## 2015-03-22 ENCOUNTER — Encounter
Payer: Commercial Managed Care - HMO | Attending: Physical Medicine & Rehabilitation | Admitting: Physical Medicine & Rehabilitation

## 2015-03-22 VITALS — BP 138/51 | HR 74 | Resp 16

## 2015-03-22 DIAGNOSIS — M160 Bilateral primary osteoarthritis of hip: Secondary | ICD-10-CM | POA: Diagnosis not present

## 2015-03-22 DIAGNOSIS — M7581 Other shoulder lesions, right shoulder: Secondary | ICD-10-CM | POA: Insufficient documentation

## 2015-03-22 DIAGNOSIS — M25551 Pain in right hip: Secondary | ICD-10-CM | POA: Insufficient documentation

## 2015-03-22 DIAGNOSIS — M7582 Other shoulder lesions, left shoulder: Secondary | ICD-10-CM | POA: Diagnosis present

## 2015-03-22 DIAGNOSIS — M24852 Other specific joint derangements of left hip, not elsewhere classified: Secondary | ICD-10-CM | POA: Diagnosis present

## 2015-03-22 DIAGNOSIS — M19011 Primary osteoarthritis, right shoulder: Secondary | ICD-10-CM

## 2015-03-22 DIAGNOSIS — Z79899 Other long term (current) drug therapy: Secondary | ICD-10-CM

## 2015-03-22 DIAGNOSIS — Z5181 Encounter for therapeutic drug level monitoring: Secondary | ICD-10-CM

## 2015-03-22 DIAGNOSIS — M25552 Pain in left hip: Secondary | ICD-10-CM | POA: Diagnosis present

## 2015-03-22 DIAGNOSIS — G894 Chronic pain syndrome: Secondary | ICD-10-CM | POA: Diagnosis not present

## 2015-03-22 DIAGNOSIS — M19012 Primary osteoarthritis, left shoulder: Secondary | ICD-10-CM

## 2015-03-22 MED ORDER — OXYCODONE-ACETAMINOPHEN 10-325 MG PO TABS: 1.0000 | ORAL_TABLET | Freq: Four times a day (QID) | ORAL | Status: DC | PRN

## 2015-03-22 NOTE — Patient Instructions (Signed)
PLEASE CALL ME WITH ANY PROBLEMS OR QUESTIONS (#[phone]).      

## 2015-03-22 NOTE — Progress Notes (Signed)
Subjective:    Patient ID: [name], male    DOB: [birth year], 69 y.o.   MRN: [id number]  HPI   [name] is here in follow up of his chronic pain. His pain levels have been essentially the same. Pain scores are a little better compared to his last visit with me.   Sleep sometimes is an issue. He typically sleeps 4-8 hours per night. Pain is not an issue, but sometimes he gets "things on his mind" which keep him awake.   Pain Inventory Average Pain 9 Pain Right Now 6 My pain is aching  In the last 24 hours, has pain interfered with the following? General activity 8 Relation with others 9 Enjoyment of life 10 What TIME of day is your pain at its worst? daytime Sleep (in general) NA  Pain is worse with: walking and some activites Pain improves with: medication Relief from Meds: 8  Mobility how many minutes can you walk? 10 ability to climb steps?  yes do you drive?  yes  Function Do you have any goals in this area?  no  Neuro/Psych No problems in this area  Prior Studies Any changes since last visit?  no  Physicians involved in your care Any changes since last visit?  no   Family History  Problem Relation Age of Onset  . Cancer Mother    History   Social History  . Marital Status: Widowed    Spouse Name: N/A  . Number of Children: N/A  . Years of Education: N/A   Social History Main Topics  . Smoking status: Former Smoker -- 3.00 packs/day for 40 years    Types: Cigarettes    Quit date: 01/16/2010  . Smokeless tobacco: Never Used  . Alcohol Use: 7.2 oz/week    12 Cans of beer per week  . Drug Use: No  . Sexual Activity: Not on file   Other Topics Concern  . None   Social History Narrative   Past Surgical History  Procedure Laterality Date  . Total hip arthroplasty      left hip  . Pr vein bypass graft,aorto-fem-pop  03-06-11    Right Common Femoral to BK popliteal BPG  . Hernia repair  1953  . Joint replacement    . Lower  extremity angiogram Right 10/17/2011    Procedure: LOWER EXTREMITY ANGIOGRAM;  Surgeon: Conrad Brewster, MD;  Location: [facility] CATH LAB;  Service: Cardiovascular;  Laterality: Right;  . Abdominal angiogram N/A 10/17/2011    Procedure: ABDOMINAL ANGIOGRAM;  Surgeon: Conrad Colby, MD;  Location: [facility] CATH LAB;  Service: Cardiovascular;  Laterality: N/A;   Past Medical History  Diagnosis Date  . Diabetes mellitus age 68    insulin dependent  . Arthritis   . Reflux   . Constipation 02/12/11  . Leg pain 02/12/11    with walking  . Joint pain 02/12/11    right shoulder  . Hypertension   . Anemia   . Peripheral vascular disease   . Anemia 01/07/2012  . History of tobacco abuse 01/07/2012  . Shortness of breath   . Pneumonia   . GERD (gastroesophageal reflux disease)    BP 138/51 mmHg  Pulse 74  Resp 16  SpO2 99%  Opioid Risk Score:   Fall Risk Score: Moderate Fall Risk (6-13 points) (previously educated and given handout)`1  Depression screen [facility] 2/9  Depression screen [facility] 2/9 02/06/2015 10/31/2014 07/18/2014 04/08/2014 01/07/2014 10/06/2013  Decreased Interest 0 0  1 0 0 0  Down, Depressed, Hopeless 0 0 1 0 0 0  PHQ - 2 Score 0 0 2 0 0 0  Altered sleeping - - 3 - - -  Tired, decreased energy - - 3 - - -  Change in appetite - - 1 - - -  Feeling bad or failure about yourself  - - 0 - - -  Trouble concentrating - - 0 - - -  Moving slowly or fidgety/restless - - 0 - - -  Suicidal thoughts - - 0 - - -  PHQ-9 Score - - 9 - - -     Review of Systems  Musculoskeletal: Positive for arthralgias.  All other systems reviewed and are negative.      Objective:   Physical Exam  General: Alert and oriented x 3, No apparent distress, doesn't appear in pain  HEENT: Head is normocephalic, atraumatic, PERRLA, EOMI, sclera anicteric, oral mucosa pink and moist, dentition intact, ext ear canals clear,  Neck: Supple without JVD or lymphadenopathy  Heart: Reg rate and rhythm. No murmurs rubs or gallops    Chest: CTA bilaterally without wheezes, rales, or rhonchi; no distress  Abdomen: Soft, non-tender, non-distended, bowel sounds positive.  Extremities: No clubbing, cyanosis, or edema. Feet are cool.  Skin: Clean and intact without signs of breakdown  Neuro: Pt is cognitively appropriate with normal insight, memory, and awareness. Cranial nerves 2-12 are intact. Sensory exam is normal except for distally over the toes on both feet. . Reflexes are 2+ in all 4's. Fine motor coordination is intact. No tremors. Motor function is grossly 5/5.  Musculoskeletal: pain in bilateral shoulders, right more than left with IR/ER. +impingement maneuvers bilaterally. Biceps tendons only mildly tender. Cross arm maneuver equivocal. He decreased scapular mobility. Low back minimally tender with rom.  No gross asymmetries. Greater trochs were tender. Mild pain along hip flexors. Psych: Pt's affect is appropriate. Pt is cooperative   Assessment & Plan:   1. 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 Hx of PVD s/p BPG--mild peripheral neuropathy  4. Left hip pain--mild rectus femoris tendonitis?/snapping hip    Plan:  1. Recommended ongoing stretching before activities and on a daily basis.  2. Will add flexeril for muscle spasm and see if he has covg for this.  3. Percocet refilled #120 today.  A second rx was provided for next month 4. Ice prn for hips  5. I or NP will see him back in about a month. 30 minutes of face to face patient care time were spent during this visit. All questions were encouraged and answered.

## 2015-03-23 LAB — PMP ALCOHOL METABOLITE (ETG)

## 2015-03-27 LAB — ETHYL GLUCURONIDE, URINE
ETHYL SULFATE (ETS): 1091 ng/mL — AB (ref <100)
Ethyl Glucuronide (EtG): 4503 ng/mL — ABNORMAL HIGH (ref <500)

## 2015-03-27 LAB — OXYCODONE, URINE (LC/MS-MS)
NOROXYCODONE, UR: 553 ng/mL (ref <50)
OXYCODONE, UR: 552 ng/mL (ref <50)
Oxymorphone: 631 ng/mL (ref <50)

## 2015-03-28 LAB — PRESCRIPTION MONITORING PROFILE (SOLSTAS)
Amphetamine/Meth: NEGATIVE ng/mL
BENZODIAZEPINE SCREEN, URINE: NEGATIVE ng/mL
BUPRENORPHINE, URINE: NEGATIVE ng/mL
Barbiturate Screen, Urine: NEGATIVE ng/mL
CARISOPRODOL, URINE: NEGATIVE ng/mL
COCAINE METABOLITES: NEGATIVE ng/mL
Cannabinoid Scrn, Ur: NEGATIVE ng/mL
Creatinine, Urine: 55.15 mg/dL (ref >20.0)
ECSTASY: NEGATIVE ng/mL
FENTANYL URINE: NEGATIVE ng/mL
MEPERIDINE UR: NEGATIVE ng/mL
Methadone Screen, Urine: NEGATIVE ng/mL
Nitrites, Initial: NEGATIVE ug/mL
Opiate Screen, Urine: NEGATIVE ng/mL
Propoxyphene: NEGATIVE ng/mL
Tapentadol, urine: NEGATIVE ng/mL
Tramadol Scrn, Ur: NEGATIVE ng/mL
ZOLPIDEM, URINE: NEGATIVE ng/mL
pH, Initial: 5.8 pH (ref 4.5–8.9)

## 2015-04-05 NOTE — Progress Notes (Signed)
Urine drug screen for this encounter is consistent for prescribed medication  However it is also positive for alcohol. He previously had a positive alcohol 03/16/2014.

## 2015-04-28 ENCOUNTER — Telehealth: Payer: Self-pay | Admitting: Nurse Practitioner

## 2015-05-02 NOTE — Telephone Encounter (Signed)
LMOM that referral had been made to [facility] and has been attached to his next appointment.

## 2015-05-15 ENCOUNTER — Ambulatory Visit (INDEPENDENT_AMBULATORY_CARE_PROVIDER_SITE_OTHER): Payer: Commercial Managed Care - HMO | Admitting: Nurse Practitioner

## 2015-05-15 ENCOUNTER — Encounter: Payer: Self-pay | Admitting: Nurse Practitioner

## 2015-05-15 VITALS — BP 141/73 | HR 79 | Temp 96.9°F | Ht 71.0 in | Wt 159.4 lb

## 2015-05-15 DIAGNOSIS — G894 Chronic pain syndrome: Secondary | ICD-10-CM | POA: Diagnosis not present

## 2015-05-15 DIAGNOSIS — K219 Gastro-esophageal reflux disease without esophagitis: Secondary | ICD-10-CM | POA: Diagnosis not present

## 2015-05-15 DIAGNOSIS — G4700 Insomnia, unspecified: Secondary | ICD-10-CM

## 2015-05-15 DIAGNOSIS — M549 Dorsalgia, unspecified: Secondary | ICD-10-CM

## 2015-05-15 DIAGNOSIS — R9431 Abnormal electrocardiogram [ECG] [EKG]: Secondary | ICD-10-CM | POA: Diagnosis not present

## 2015-05-15 DIAGNOSIS — I10 Essential (primary) hypertension: Secondary | ICD-10-CM | POA: Diagnosis not present

## 2015-05-15 DIAGNOSIS — I739 Peripheral vascular disease, unspecified: Secondary | ICD-10-CM | POA: Diagnosis not present

## 2015-05-15 DIAGNOSIS — M24852 Other specific joint derangements of left hip, not elsewhere classified: Secondary | ICD-10-CM

## 2015-05-15 DIAGNOSIS — M160 Bilateral primary osteoarthritis of hip: Secondary | ICD-10-CM

## 2015-05-15 DIAGNOSIS — M19012 Primary osteoarthritis, left shoulder: Secondary | ICD-10-CM

## 2015-05-15 DIAGNOSIS — Z79899 Other long term (current) drug therapy: Secondary | ICD-10-CM

## 2015-05-15 DIAGNOSIS — E1165 Type 2 diabetes mellitus with hyperglycemia: Secondary | ICD-10-CM

## 2015-05-15 DIAGNOSIS — M25551 Pain in right hip: Secondary | ICD-10-CM | POA: Diagnosis not present

## 2015-05-15 DIAGNOSIS — I5032 Chronic diastolic (congestive) heart failure: Secondary | ICD-10-CM

## 2015-05-15 DIAGNOSIS — Z5181 Encounter for therapeutic drug level monitoring: Secondary | ICD-10-CM

## 2015-05-15 DIAGNOSIS — M25552 Pain in left hip: Secondary | ICD-10-CM

## 2015-05-15 DIAGNOSIS — M19011 Primary osteoarthritis, right shoulder: Secondary | ICD-10-CM

## 2015-05-15 DIAGNOSIS — Z87891 Personal history of nicotine dependence: Secondary | ICD-10-CM | POA: Diagnosis not present

## 2015-05-15 DIAGNOSIS — G8929 Other chronic pain: Secondary | ICD-10-CM

## 2015-05-15 DIAGNOSIS — E785 Hyperlipidemia, unspecified: Secondary | ICD-10-CM

## 2015-05-15 LAB — POCT GLYCOSYLATED HEMOGLOBIN (HGB A1C): Hemoglobin A1C: 7.6

## 2015-05-15 MED ORDER — OXYCODONE-ACETAMINOPHEN 10-325 MG PO TABS: 1.0000 | ORAL_TABLET | Freq: Four times a day (QID) | ORAL | Status: DC | PRN

## 2015-05-15 NOTE — Progress Notes (Signed)
Subjective:    Patient ID: [name], male    DOB: [birth year], 69 y.o.   MRN: [id number]  Patient in today for follow up of chronic medical problems.   Hyperlipidemia This is a chronic problem. The current episode started more than 1 year ago. The problem is uncontrolled. Recent lipid tests were reviewed and are high. Pertinent negatives include no chest pain or shortness of breath. He is currently on no antihyperlipidemic treatment. Risk factors for coronary artery disease include diabetes mellitus, dyslipidemia, hypertension and male sex.  Hypertension This is a chronic problem. The current episode started more than 1 year ago. The problem is controlled. Associated symptoms include neck pain. Pertinent negatives include no chest pain, palpitations or shortness of breath. Risk factors for coronary artery disease include diabetes mellitus, dyslipidemia and male gender. Past treatments include diuretics and ACE inhibitors. The current treatment provides moderate improvement. Compliance problems include diet and exercise.  Hypertensive end-organ damage includes PVD.  Diabetes He presents for his follow-up diabetic visit. He has type 2 diabetes mellitus. No MedicAlert identification noted. His disease course has been stable. Pertinent negatives for hypoglycemia include no confusion or pallor. Pertinent negatives for diabetes include no chest pain, no polydipsia, no polyphagia, no polyuria, no visual change and no weakness. Diabetic complications include PVD. Risk factors for coronary artery disease include diabetes mellitus, dyslipidemia, hypertension and male sex. Current diabetic treatment includes insulin injections. He is compliant with treatment most of the time. His weight is stable. His breakfast blood glucose is taken between 8-9 am. His breakfast blood glucose range is generally >200 mg/dl. His overall blood glucose range is >200 mg/dl. An ACE inhibitor/angiotensin II receptor blocker is being  taken. He does not see a podiatrist.Eye exam is not current.  Anemia There has been no abdominal pain, confusion, light-headedness, pallor or palpitations.  Gastrophageal Reflux He reports no abdominal pain, no chest pain, no coughing or no sore throat.  PVD Plavix dialy- no bleeding that he is aware of . No longer sees PV surgeon Chronic back and left hip pain Seeing pain management in [facility] Dr Naaman Plummer currently and taking Percocet 10/313m 1 po QID prn. Des not want to continue pain clinic. Would like to come here because of expense of going there. Insomnia trazadone to sleep - works well- feels rested when he awakens.  Review of Systems  HENT: Negative for sore throat.   Respiratory: Negative for cough and shortness of breath.   Cardiovascular: Negative for chest pain and palpitations.  Gastrointestinal: Negative for abdominal pain.  Endocrine: Negative for polydipsia, polyphagia and polyuria.  Musculoskeletal: Positive for arthralgias and neck pain.  Skin: Negative for pallor.  Neurological: Negative for weakness and light-headedness.  Psychiatric/Behavioral: Negative for confusion.  All other systems reviewed and are negative.      Objective:   Physical Exam  Constitutional: He is oriented to person, place, and time. He appears well-developed and well-nourished.  HENT:  Head: Normocephalic.  Right Ear: External ear normal.  Left Ear: External ear normal.  Nose: Nose normal.  Mouth/Throat: Oropharynx is clear and moist.  Eyes: EOM are normal. Pupils are equal, round, and reactive to light.  Neck: Normal range of motion. Neck supple. No JVD present. No thyromegaly present.  Cardiovascular: Normal rate, regular rhythm, normal heart sounds and intact distal pulses.  Exam reveals no gallop and no friction rub.   No murmur heard. Pulmonary/Chest: Effort normal and breath sounds normal. No respiratory distress. He has no wheezes. He  has no rales. He exhibits no tenderness.   Abdominal: Soft. Bowel sounds are normal. He exhibits no mass. There is no tenderness.  Musculoskeletal: Normal range of motion. He exhibits no edema.  Pain with movement on lumbar spine (-) SLR bil  Lymphadenopathy:    He has no cervical adenopathy.  Neurological: He is alert and oriented to person, place, and time. No cranial nerve deficit.  Skin: Skin is warm and dry.  Healing burn to lower ant abdomen- no erythema or signs of infection.  Psychiatric: He has a normal mood and affect. His behavior is normal. Judgment and thought content normal.   BP 141/73 mmHg  Pulse 79  Temp(Src) 96.9 F (36.1 C) (Oral)  Ht 5' 11"  (1.803 m)  Wt 159 lb 6.4 oz (72.303 kg)  BMI 22.24 kg/m2     Results for orders placed or performed in visit on 05/15/15  POCT glycosylated hemoglobin (Hb A1C)  Result Value Ref Range   Hemoglobin A1C 7.6         Assessment & Plan:    1. Hyperlipidemia with target LDL less than 70 Low fat diet - Lipid panel  2. Essential hypertension Do not add slat to diet - CMP14+EGFR  3. Type 2 diabetes mellitus with hyperglycemia Watch carbs in diet - POCT glycosylated hemoglobin (Hb A1C)  4. PVD (peripheral vascular disease)  5. Chronic diastolic congestive heart failure  6. Gastroesophageal reflux disease without esophagitis Avoid spicy foods Do not eat 2 hours prior to bedtime   7. Insomnia Bedtime ritual  8. History of tobacco abuse  9. Nonspecific abnormal electrocardiogram (ECG) (EKG)  10. Chronic back pain  11. Bilateral hip pain - oxyCODONE-acetaminophen (PERCOCET) 10-325 MG per tablet; Take 1 tablet by mouth every 6 (six) hours as needed for pain.  Dispense: 120 tablet; Refill: 0  12. Chronic pain syndrome - oxyCODONE-acetaminophen (PERCOCET) 10-325 MG per tablet; Take 1 tablet by mouth every 6 (six) hours as needed for pain.  Dispense: 120 tablet; Refill: 0  13. Encounter for long-term (current) use of other high-risk medications -  oxyCODONE-acetaminophen (PERCOCET) 10-325 MG per tablet; Take 1 tablet by mouth every 6 (six) hours as needed for pain.  Dispense: 120 tablet; Refill: 0  14. Encounter for therapeutic drug monitoring - oxyCODONE-acetaminophen (PERCOCET) 10-325 MG per tablet; Take 1 tablet by mouth every 6 (six) hours as needed for pain.  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  Dispense: 120 tablet; Refill: 0  16. Primary osteoarthritis of both shoulders - oxyCODONE-acetaminophen (PERCOCET) 10-325 MG per tablet; Take 1 tablet by mouth every 6 (six) hours as needed for pain.  Dispense: 120 tablet; Refill: 0  17. Primary osteoarthritis of both hips - oxyCODONE-acetaminophen (PERCOCET) 10-325 MG per tablet; Take 1 tablet by mouth every 6 (six) hours as needed for pain.  Dispense: 120 tablet; Refill: 0   Agreed to take over pain medication- pain contract signed Labs pending Health maintenance reviewed Diet and exercise encouraged Continue all meds Follow up  In 3 months   [place], FNP

## 2015-05-15 NOTE — Patient Instructions (Addendum)
If you are taking a controlled substance please remember the conditions agreed upon in your Controlled-Substance Contract. - It is the responsibility of each patient to secure and handle these medications.  This includes taking them as prescribed by your physician.  Our office is not responsible for lost or stolen medication and in such instances replacement prescription may not be provided.   - Controlled substances for anxiety, sleep / insomnia, pain and attention deficient disorders carry a risk of developing dependence.  Also your medical provider must continue to weigh the risk of side effects with the justification of improvement when using these medications.  Please discuss any concerns about dependence or side effects with your provider.  - No refills will be given after hours or on weekends -Use of abusive language or threatening behavior toward our office or clinical staff will not be tolerated and may result in dismissal from medical care at [facility]. -Our office reserves the right to conduct unannounced and periodic urine or blood screenings for patients taking controlled substances.  -You should bring your medications to each visit.  Pill counts are sometimes conducted and failure to do so might result in postponement of prescription / refills. -You agree to maintain a close relationship with your provider by keeping appointments, reporting problems and taking your medication as prescribed.  If you receive controlled substances from and outside provider (ie dentist, emergency room visit, etc) it is your responsibility to make sure that your provider is aware of any additional prescriptions.  - Our office also checks the controlled substance database regularly.     

## 2015-05-16 LAB — LIPID PANEL
CHOLESTEROL TOTAL: 159 mg/dL (ref 100–199)
Chol/HDL Ratio: 2 ratio units (ref 0.0–5.0)
HDL: 80 mg/dL (ref >39)
LDL Calculated: 63 mg/dL (ref 0–99)
TRIGLYCERIDES: 78 mg/dL (ref 0–149)
VLDL Cholesterol Cal: 16 mg/dL (ref 5–40)

## 2015-05-16 LAB — CMP14+EGFR
ALT: 11 IU/L (ref 0–44)
AST: 14 IU/L (ref 0–40)
Albumin/Globulin Ratio: 1.8 (ref 1.1–2.5)
Albumin: 3.9 g/dL (ref 3.6–4.8)
Alkaline Phosphatase: 62 IU/L (ref 39–117)
BUN/Creatinine Ratio: 18 (ref 10–22)
BUN: 13 mg/dL (ref 8–27)
Bilirubin Total: 0.3 mg/dL (ref 0.0–1.2)
CALCIUM: 8.6 mg/dL (ref 8.6–10.2)
CHLORIDE: 104 mmol/L (ref 97–108)
CO2: 22 mmol/L (ref 18–29)
Creatinine, Ser: 0.74 mg/dL — ABNORMAL LOW (ref 0.76–1.27)
GFR calc Af Amer: 109 mL/min/{1.73_m2} (ref >59)
GFR calc non Af Amer: 94 mL/min/{1.73_m2} (ref >59)
GLUCOSE: 274 mg/dL — AB (ref 65–99)
Globulin, Total: 2.2 g/dL (ref 1.5–4.5)
Potassium: 5 mmol/L (ref 3.5–5.2)
Sodium: 139 mmol/L (ref 134–144)
Total Protein: 6.1 g/dL (ref 6.0–8.5)

## 2015-05-18 ENCOUNTER — Ambulatory Visit: Payer: Medicare HMO | Admitting: Registered Nurse

## 2015-06-08 ENCOUNTER — Encounter: Payer: Self-pay | Admitting: *Deleted

## 2015-06-16 ENCOUNTER — Telehealth: Payer: Self-pay | Admitting: Nurse Practitioner

## 2015-06-16 DIAGNOSIS — M25552 Pain in left hip: Principal | ICD-10-CM

## 2015-06-16 DIAGNOSIS — M160 Bilateral primary osteoarthritis of hip: Secondary | ICD-10-CM

## 2015-06-16 DIAGNOSIS — M25551 Pain in right hip: Secondary | ICD-10-CM

## 2015-06-16 DIAGNOSIS — M19011 Primary osteoarthritis, right shoulder: Secondary | ICD-10-CM

## 2015-06-16 DIAGNOSIS — M19012 Primary osteoarthritis, left shoulder: Secondary | ICD-10-CM

## 2015-06-16 DIAGNOSIS — Z79899 Other long term (current) drug therapy: Secondary | ICD-10-CM

## 2015-06-16 DIAGNOSIS — G894 Chronic pain syndrome: Secondary | ICD-10-CM

## 2015-06-16 DIAGNOSIS — Z5181 Encounter for therapeutic drug level monitoring: Secondary | ICD-10-CM

## 2015-06-16 DIAGNOSIS — M24852 Other specific joint derangements of left hip, not elsewhere classified: Secondary | ICD-10-CM

## 2015-06-16 MED ORDER — OXYCODONE-ACETAMINOPHEN 10-325 MG PO TABS: 1.0000 | ORAL_TABLET | Freq: Four times a day (QID) | ORAL | Status: DC | PRN

## 2015-06-16 NOTE — Telephone Encounter (Signed)
lmovm that written Rx is at front desk ready for pickup 

## 2015-06-16 NOTE — Telephone Encounter (Signed)
Pain rx ready for pick up  

## 2015-07-17 ENCOUNTER — Other Ambulatory Visit: Payer: Self-pay | Admitting: Nurse Practitioner

## 2015-07-17 DIAGNOSIS — M19012 Primary osteoarthritis, left shoulder: Secondary | ICD-10-CM

## 2015-07-17 DIAGNOSIS — M24852 Other specific joint derangements of left hip, not elsewhere classified: Secondary | ICD-10-CM

## 2015-07-17 DIAGNOSIS — M160 Bilateral primary osteoarthritis of hip: Secondary | ICD-10-CM

## 2015-07-17 DIAGNOSIS — G894 Chronic pain syndrome: Secondary | ICD-10-CM

## 2015-07-17 DIAGNOSIS — Z5181 Encounter for therapeutic drug level monitoring: Secondary | ICD-10-CM

## 2015-07-17 DIAGNOSIS — M25552 Pain in left hip: Principal | ICD-10-CM

## 2015-07-17 DIAGNOSIS — M19011 Primary osteoarthritis, right shoulder: Secondary | ICD-10-CM

## 2015-07-17 DIAGNOSIS — M25551 Pain in right hip: Secondary | ICD-10-CM

## 2015-07-17 DIAGNOSIS — Z79899 Other long term (current) drug therapy: Secondary | ICD-10-CM

## 2015-07-18 MED ORDER — OXYCODONE-ACETAMINOPHEN 10-325 MG PO TABS: 1.0000 | ORAL_TABLET | Freq: Four times a day (QID) | ORAL | Status: DC | PRN

## 2015-07-18 NOTE — Telephone Encounter (Signed)
Pt aware written Rx is at front desk ready for pickup  

## 2015-07-18 NOTE — Telephone Encounter (Signed)
pain med ready for pick up

## 2015-08-16 ENCOUNTER — Other Ambulatory Visit: Payer: Self-pay | Admitting: Nurse Practitioner

## 2015-08-16 DIAGNOSIS — M24852 Other specific joint derangements of left hip, not elsewhere classified: Secondary | ICD-10-CM

## 2015-08-16 DIAGNOSIS — M25551 Pain in right hip: Secondary | ICD-10-CM

## 2015-08-16 DIAGNOSIS — Z79899 Other long term (current) drug therapy: Secondary | ICD-10-CM

## 2015-08-16 DIAGNOSIS — M19011 Primary osteoarthritis, right shoulder: Secondary | ICD-10-CM

## 2015-08-16 DIAGNOSIS — M25552 Pain in left hip: Principal | ICD-10-CM

## 2015-08-16 DIAGNOSIS — M19012 Primary osteoarthritis, left shoulder: Secondary | ICD-10-CM

## 2015-08-16 DIAGNOSIS — Z5181 Encounter for therapeutic drug level monitoring: Secondary | ICD-10-CM

## 2015-08-16 DIAGNOSIS — M160 Bilateral primary osteoarthritis of hip: Secondary | ICD-10-CM

## 2015-08-16 DIAGNOSIS — G894 Chronic pain syndrome: Secondary | ICD-10-CM

## 2015-08-16 MED ORDER — OXYCODONE-ACETAMINOPHEN 10-325 MG PO TABS: 1.0000 | ORAL_TABLET | Freq: Four times a day (QID) | ORAL | Status: DC | PRN

## 2015-08-16 NOTE — Telephone Encounter (Signed)
Left a message:  Script for pain medication is ready to pick up here at office. He will need to schedule a follow up appointment for further refills.

## 2015-08-16 NOTE — Telephone Encounter (Signed)
He needs to come in and be seen before this medication can be filled.

## 2015-08-16 NOTE — Telephone Encounter (Signed)
Last filled 07/18/15. Last seen MMM 05/15/15, next appt 08/30/15. Rx will print

## 2015-08-16 NOTE — Telephone Encounter (Signed)
I am printing the prescription. He will need to come into clinic to be seen for future prescriptions.

## 2015-08-28 ENCOUNTER — Ambulatory Visit: Payer: Commercial Managed Care - HMO | Admitting: Nurse Practitioner

## 2015-08-30 ENCOUNTER — Ambulatory Visit (INDEPENDENT_AMBULATORY_CARE_PROVIDER_SITE_OTHER): Payer: Commercial Managed Care - HMO | Admitting: Nurse Practitioner

## 2015-08-30 ENCOUNTER — Encounter: Payer: Self-pay | Admitting: Nurse Practitioner

## 2015-08-30 VITALS — BP 143/69 | HR 63 | Temp 96.7°F | Ht 71.0 in | Wt 163.0 lb

## 2015-08-30 DIAGNOSIS — E785 Hyperlipidemia, unspecified: Secondary | ICD-10-CM | POA: Diagnosis not present

## 2015-08-30 DIAGNOSIS — L821 Other seborrheic keratosis: Secondary | ICD-10-CM

## 2015-08-30 DIAGNOSIS — I739 Peripheral vascular disease, unspecified: Secondary | ICD-10-CM | POA: Diagnosis not present

## 2015-08-30 DIAGNOSIS — Z23 Encounter for immunization: Secondary | ICD-10-CM

## 2015-08-30 DIAGNOSIS — G894 Chronic pain syndrome: Secondary | ICD-10-CM | POA: Diagnosis not present

## 2015-08-30 DIAGNOSIS — G8929 Other chronic pain: Secondary | ICD-10-CM

## 2015-08-30 DIAGNOSIS — M549 Dorsalgia, unspecified: Secondary | ICD-10-CM

## 2015-08-30 DIAGNOSIS — I10 Essential (primary) hypertension: Secondary | ICD-10-CM | POA: Diagnosis not present

## 2015-08-30 DIAGNOSIS — E291 Testicular hypofunction: Secondary | ICD-10-CM | POA: Diagnosis not present

## 2015-08-30 DIAGNOSIS — G4700 Insomnia, unspecified: Secondary | ICD-10-CM | POA: Diagnosis not present

## 2015-08-30 DIAGNOSIS — D509 Iron deficiency anemia, unspecified: Secondary | ICD-10-CM

## 2015-08-30 DIAGNOSIS — K219 Gastro-esophageal reflux disease without esophagitis: Secondary | ICD-10-CM

## 2015-08-30 DIAGNOSIS — E119 Type 2 diabetes mellitus without complications: Secondary | ICD-10-CM

## 2015-08-30 DIAGNOSIS — N5201 Erectile dysfunction due to arterial insufficiency: Secondary | ICD-10-CM | POA: Diagnosis not present

## 2015-08-30 DIAGNOSIS — I70213 Atherosclerosis of native arteries of extremities with intermittent claudication, bilateral legs: Secondary | ICD-10-CM

## 2015-08-30 DIAGNOSIS — I5032 Chronic diastolic (congestive) heart failure: Secondary | ICD-10-CM | POA: Diagnosis not present

## 2015-08-30 DIAGNOSIS — L570 Actinic keratosis: Secondary | ICD-10-CM

## 2015-08-30 LAB — POCT UA - MICROALBUMIN: Microalbumin Ur, POC: 20 mg/L

## 2015-08-30 LAB — POCT GLYCOSYLATED HEMOGLOBIN (HGB A1C): Hemoglobin A1C: 9.1

## 2015-08-30 MED ORDER — CLOPIDOGREL BISULFATE 75 MG PO TABS: 75.0000 mg | ORAL_TABLET | Freq: Every day | ORAL | Status: AC

## 2015-08-30 MED ORDER — SILDENAFIL CITRATE 100 MG PO TABS: 100.0000 mg | ORAL_TABLET | Freq: Every day | ORAL | Status: AC | PRN

## 2015-08-30 MED ORDER — LANTUS SOLOSTAR 100 UNIT/ML ~~LOC~~ SOPN: 40.0000 [IU] | PEN_INJECTOR | Freq: Every day | SUBCUTANEOUS | Status: DC

## 2015-08-30 MED ORDER — IRON 325 (65 FE) MG PO TABS: 1.0000 | ORAL_TABLET | Freq: Three times a day (TID) | ORAL | Status: AC

## 2015-08-30 MED ORDER — TRAZODONE HCL 50 MG PO TABS: ORAL_TABLET | ORAL | Status: DC

## 2015-08-30 MED ORDER — LANTUS SOLOSTAR 100 UNIT/ML ~~LOC~~ SOPN: 35.0000 [IU] | PEN_INJECTOR | Freq: Every day | SUBCUTANEOUS | Status: DC

## 2015-08-30 MED ORDER — TESTOSTERONE 40.5 MG/2.5GM (1.62%) TD GEL: 2.0000 | Freq: Every day | TRANSDERMAL | Status: DC

## 2015-08-30 MED ORDER — NOVOLOG FLEXPEN 100 UNIT/ML ~~LOC~~ SOPN: 16.0000 [IU] | PEN_INJECTOR | Freq: Three times a day (TID) | SUBCUTANEOUS | Status: AC

## 2015-08-30 MED ORDER — PANTOPRAZOLE SODIUM 40 MG PO TBEC: 40.0000 mg | DELAYED_RELEASE_TABLET | Freq: Every day | ORAL | Status: AC

## 2015-08-30 MED ORDER — OXYCODONE-ACETAMINOPHEN 10-325 MG PO TABS: 1.0000 | ORAL_TABLET | Freq: Four times a day (QID) | ORAL | Status: DC | PRN

## 2015-08-30 MED ORDER — OMEPRAZOLE 40 MG PO CPDR: 40.0000 mg | DELAYED_RELEASE_CAPSULE | Freq: Every day | ORAL | Status: DC

## 2015-08-30 MED ORDER — FUROSEMIDE 20 MG PO TABS: 20.0000 mg | ORAL_TABLET | Freq: Every day | ORAL | Status: AC

## 2015-08-30 MED ORDER — LISINOPRIL 5 MG PO TABS: 5.0000 mg | ORAL_TABLET | Freq: Every day | ORAL | Status: AC

## 2015-08-30 NOTE — Patient Instructions (Signed)

## 2015-08-30 NOTE — Addendum Note (Signed)
Addended by: Rolena Infante on: 08/30/2015 05:06 PM   Modules accepted: Orders

## 2015-08-30 NOTE — Addendum Note (Signed)
Addended by: Chevis Pretty on: 08/30/2015 09:38 AM   Modules accepted: Orders

## 2015-08-30 NOTE — Addendum Note (Signed)
Addended by: Chevis Pretty on: 08/30/2015 09:36 AM   Modules accepted: Orders

## 2015-08-30 NOTE — Progress Notes (Addendum)
Subjective:    Patient ID: [name], male    DOB: Feb 23, 1946, 69 y.o.   MRN: [id number]  Patient in today for follow up of chronic medical problems.   Hyperlipidemia This is a chronic problem. The current episode started more than 1 year ago. The problem is uncontrolled. Recent lipid tests were reviewed and are high. Pertinent negatives include no chest pain or shortness of breath. He is currently on no antihyperlipidemic treatment. Risk factors for coronary artery disease include diabetes mellitus, dyslipidemia, hypertension and male sex.  Hypertension This is a chronic problem. The current episode started more than 1 year ago. The problem is controlled. Associated symptoms include neck pain. Pertinent negatives include no chest pain, palpitations or shortness of breath. Risk factors for coronary artery disease include diabetes mellitus, dyslipidemia and male gender. Past treatments include diuretics and ACE inhibitors. The current treatment provides moderate improvement. Compliance problems include diet and exercise.  Hypertensive end-organ damage includes PVD.  Diabetes He presents for his follow-up diabetic visit. He has type 2 diabetes mellitus. No MedicAlert identification noted. His disease course has been stable. Pertinent negatives for hypoglycemia include no confusion or pallor. Pertinent negatives for diabetes include no chest pain, no polydipsia, no polyphagia, no polyuria, no visual change and no weakness. Diabetic complications include PVD. Risk factors for coronary artery disease include diabetes mellitus, dyslipidemia, hypertension and male sex. Current diabetic treatment includes insulin injections. He is compliant with treatment most of the time. His weight is stable. His breakfast blood glucose is taken between 8-9 am. His breakfast blood glucose range is generally >200 mg/dl. His overall blood glucose range is >200 mg/dl. An ACE inhibitor/angiotensin II receptor blocker is being  taken. He does not see a podiatrist.Eye exam is not current.  Anemia There has been no abdominal pain, confusion, light-headedness, pallor or palpitations.  Gastrophageal Reflux He reports no abdominal pain, no chest pain, no coughing or no sore throat.  PVD Plavix dialy- no bleeding that he is aware of . No longer sees PV surgeon Chronic back and left hip pain Seeing pain management in [facility] Dr Naaman Plummer currently and taking Percocet 10/360m 1 po QID prn. Des not want to continue pain clinic. Would like to come here because of expense of going there. Insomnia trazadone to sleep - works well- feels rested when he awakens. Hypogonadism androgel daily- helps with his energy level     Review of Systems  HENT: Negative for sore throat.   Respiratory: Negative for cough and shortness of breath.   Cardiovascular: Negative for chest pain and palpitations.  Gastrointestinal: Negative for abdominal pain.  Endocrine: Negative for polydipsia, polyphagia and polyuria.  Musculoskeletal: Positive for arthralgias and neck pain.  Skin: Negative for pallor.  Neurological: Negative for weakness and light-headedness.  Psychiatric/Behavioral: Negative for confusion.  All other systems reviewed and are negative.      Objective:   Physical Exam  Constitutional: He is oriented to person, place, and time. He appears well-developed and well-nourished.  HENT:  Head: Normocephalic.  Right Ear: External ear normal.  Left Ear: External ear normal.  Nose: Nose normal.  Mouth/Throat: Oropharynx is clear and moist.  Eyes: EOM are normal. Pupils are equal, round, and reactive to light.  Neck: Normal range of motion. Neck supple. No JVD present. No thyromegaly present.  Cardiovascular: Normal rate, regular rhythm, normal heart sounds and intact distal pulses.  Exam reveals no gallop and no friction rub.   No murmur heard. Pulmonary/Chest: Effort normal and  breath sounds normal. No respiratory distress.  He has no wheezes. He has no rales. He exhibits no tenderness.  Abdominal: Soft. Bowel sounds are normal. He exhibits no mass. There is no tenderness.  Musculoskeletal: Normal range of motion. He exhibits no edema.  Pain with movement on lumbar spine (-) SLR bil  Lymphadenopathy:    He has no cervical adenopathy.  Neurological: He is alert and oriented to person, place, and time. No cranial nerve deficit.  Skin: Skin is warm and dry.  3cm annular scaley greyish lesion right posterior shoulder  Psychiatric: He has a normal mood and affect. His behavior is normal. Judgment and thought content normal.       BP 143/69 mmHg  Pulse 63  Temp(Src) 96.7 F (35.9 C) (Oral)  Ht 5' 11"  (1.803 m)  Wt 163 lb (73.936 kg)  BMI 22.74 kg/m2   Cryotherapy of lesion right post shoulder  Results for orders placed or performed in visit on 08/30/15  POCT glycosylated hemoglobin (Hb A1C)  Result Value Ref Range   Hemoglobin A1C 9.1   POCT UA - Microalbumin  Result Value Ref Range   Microalbumin Ur, POC 20 mg/L      Assessment & Plan:   1. Hyperlipidemia with target LDL less than 70 Low fat diet - Lipid panel  2. Essential hypertension Do  Not add salt t o deit - CMP14+EGFR - lisinopril (PRINIVIL,ZESTRIL) 5 MG tablet; Take 1 tablet (5 mg total) by mouth daily.  Dispense: 90 tablet; Refill: 1 - furosemide (LASIX) 20 MG tablet; Take 1 tablet (20 mg total) by mouth daily.  Dispense: 30 tablet; Refill: 5  3. Type 2 diabetes mellitus without complication, without long-term current use of insulin (HCC) Continue  To watch carbs Increased lantus to 40u daily - POCT glycosylated hemoglobin (Hb A1C) - POCT UA - Microalbumin - Microalbumin, urine - LANTUS SOLOSTAR 100 UNIT/ML Solostar Pen; Inject 40 Units into the skin daily at 10 pm.  Dispense: 15 mL; Refill: 5 - NOVOLOG FLEXPEN 100 UNIT/ML FlexPen; Inject 16 Units into the skin 3 (three) times daily with meals.  Dispense: 15 mL; Refill: 5  4.  PVD (peripheral vascular disease) (HCC) - clopidogrel (PLAVIX) 75 MG tablet; Take 1 tablet (75 mg total) by mouth daily.  Dispense: 90 tablet; Refill: 1  5. Chronic diastolic congestive heart failure ([place])  6. Gastroesophageal reflux disease without esophagitis Avoid spicy foods Do not eat 2 hours prior to bedtime - pantoprazole (PROTONIX) 40 MG tablet; Take 1 tablet (40 mg total) by mouth daily.  Dispense: 90 tablet; Refill: 1  7. Iron deficiency anemia - Ferrous Sulfate (IRON) 325 (65 FE) MG TABS; Take 1 tablet by mouth 3 (three) times daily.  Dispense: 270 each; Refill: 1  8. Insomnia Bedtime ritual - traZODone (DESYREL) 50 MG tablet; TAKE ONE-HALF TO ONE TABLET BY MOUTH AT BEDTIME AS NEEDED FOR SLEEP  Dispense: 30 tablet; Refill: 5  9. Chronic back pain Rest Moist heat  10. Hypogonadism male - Testosterone (ANDROGEL) 40.5 MG/2.5GM (1.62%) GEL; Place 2 Squirts onto the skin daily.  Dispense: 75 g; Refill: 1  11. Erectile dysfunction due to arterial insufficiency - sildenafil (VIAGRA) 100 MG tablet; Take 1 tablet (100 mg total) by mouth daily as needed for erectile dysfunction.  Dispense: 3 tablet; Refill: 0  12. Chronic pain syndrome - oxyCODONE-acetaminophen (PERCOCET) 10-325 MG tablet; Take 1 tablet by mouth every 6 (six) hours as needed for pain.  Dispense: 120 tablet; Refill: 0  13. Atherosclerosis of native artery of both lower extremities with intermittent claudication (HCC) - clopidogrel (PLAVIX) 75 MG tablet; Take 1 tablet (75 mg total) by mouth daily.  Dispense: 90 tablet; Refill: 1  14. Benign keratosis of skin Cryotherapy care discussed    Labs pending Health maintenance reviewed Diet and exercise encouraged Continue all meds Follow up  In 3 months   [place], FNP

## 2015-08-31 DIAGNOSIS — Z23 Encounter for immunization: Secondary | ICD-10-CM | POA: Diagnosis not present

## 2015-08-31 LAB — CMP14+EGFR
ALBUMIN: 4.2 g/dL (ref 3.5–4.8)
ALT: 12 IU/L (ref 0–44)
AST: 15 IU/L (ref 0–40)
Albumin/Globulin Ratio: 1.8 (ref 1.1–2.5)
Alkaline Phosphatase: 75 IU/L (ref 39–117)
BUN / CREAT RATIO: 15 (ref 10–22)
BUN: 13 mg/dL (ref 8–27)
Bilirubin Total: 0.3 mg/dL (ref 0.0–1.2)
CALCIUM: 9.1 mg/dL (ref 8.6–10.2)
CO2: 23 mmol/L (ref 18–29)
CREATININE: 0.86 mg/dL (ref 0.76–1.27)
Chloride: 102 mmol/L (ref 97–108)
GFR calc Af Amer: 102 mL/min/{1.73_m2} (ref >59)
GFR, EST NON AFRICAN AMERICAN: 88 mL/min/{1.73_m2} (ref >59)
GLOBULIN, TOTAL: 2.3 g/dL (ref 1.5–4.5)
Glucose: 134 mg/dL — ABNORMAL HIGH (ref 65–99)
Potassium: 4.5 mmol/L (ref 3.5–5.2)
SODIUM: 142 mmol/L (ref 134–144)
TOTAL PROTEIN: 6.5 g/dL (ref 6.0–8.5)

## 2015-08-31 LAB — LIPID PANEL
CHOLESTEROL TOTAL: 157 mg/dL (ref 100–199)
Chol/HDL Ratio: 2.5 ratio units (ref 0.0–5.0)
HDL: 62 mg/dL (ref >39)
LDL Calculated: 68 mg/dL (ref 0–99)
Triglycerides: 133 mg/dL (ref 0–149)
VLDL CHOLESTEROL CAL: 27 mg/dL (ref 5–40)

## 2015-08-31 LAB — MICROALBUMIN, URINE: Microalbumin, Urine: 10.9 ug/mL

## 2015-09-14 LAB — HM DIABETES EYE EXAM

## 2015-09-19 ENCOUNTER — Telehealth: Payer: Self-pay | Admitting: Nurse Practitioner

## 2015-09-19 NOTE — Telephone Encounter (Signed)
He does not want a bp check

## 2015-10-05 ENCOUNTER — Encounter: Payer: Self-pay | Admitting: *Deleted

## 2015-10-09 ENCOUNTER — Telehealth: Payer: Self-pay | Admitting: Nurse Practitioner

## 2015-10-09 DIAGNOSIS — G894 Chronic pain syndrome: Secondary | ICD-10-CM

## 2015-10-09 MED ORDER — OXYCODONE-ACETAMINOPHEN 10-325 MG PO TABS: 1.0000 | ORAL_TABLET | Freq: Four times a day (QID) | ORAL | Status: DC | PRN

## 2015-10-09 NOTE — Telephone Encounter (Signed)
Stp and advised rx ready for pick up.

## 2015-10-09 NOTE — Telephone Encounter (Signed)
Pain med rx ready for pick up  

## 2015-11-07 ENCOUNTER — Other Ambulatory Visit: Payer: Self-pay | Admitting: Nurse Practitioner

## 2015-11-07 DIAGNOSIS — G894 Chronic pain syndrome: Secondary | ICD-10-CM

## 2015-11-07 MED ORDER — OXYCODONE-ACETAMINOPHEN 10-325 MG PO TABS: 1.0000 | ORAL_TABLET | Freq: Four times a day (QID) | ORAL | Status: DC | PRN

## 2015-11-07 NOTE — Telephone Encounter (Signed)
Pt notified rx is ready for pick up

## 2015-11-07 NOTE — Telephone Encounter (Signed)
rx ready for pickup 

## 2015-11-27 ENCOUNTER — Other Ambulatory Visit: Payer: Self-pay | Admitting: Nurse Practitioner

## 2015-11-30 ENCOUNTER — Ambulatory Visit (INDEPENDENT_AMBULATORY_CARE_PROVIDER_SITE_OTHER): Payer: Commercial Managed Care - HMO | Admitting: Nurse Practitioner

## 2015-11-30 ENCOUNTER — Encounter: Payer: Self-pay | Admitting: Nurse Practitioner

## 2015-11-30 ENCOUNTER — Telehealth: Payer: Self-pay | Admitting: Nurse Practitioner

## 2015-11-30 VITALS — BP 134/64 | HR 69 | Temp 97.2°F | Ht 71.0 in | Wt 165.0 lb

## 2015-11-30 DIAGNOSIS — E113299 Type 2 diabetes mellitus with mild nonproliferative diabetic retinopathy without macular edema, unspecified eye: Secondary | ICD-10-CM

## 2015-11-30 DIAGNOSIS — D500 Iron deficiency anemia secondary to blood loss (chronic): Secondary | ICD-10-CM | POA: Diagnosis not present

## 2015-11-30 DIAGNOSIS — M549 Dorsalgia, unspecified: Secondary | ICD-10-CM | POA: Diagnosis not present

## 2015-11-30 DIAGNOSIS — E119 Type 2 diabetes mellitus without complications: Secondary | ICD-10-CM | POA: Diagnosis not present

## 2015-11-30 DIAGNOSIS — E291 Testicular hypofunction: Secondary | ICD-10-CM | POA: Diagnosis not present

## 2015-11-30 DIAGNOSIS — D509 Iron deficiency anemia, unspecified: Secondary | ICD-10-CM | POA: Diagnosis not present

## 2015-11-30 DIAGNOSIS — G4700 Insomnia, unspecified: Secondary | ICD-10-CM | POA: Diagnosis not present

## 2015-11-30 DIAGNOSIS — I10 Essential (primary) hypertension: Secondary | ICD-10-CM

## 2015-11-30 DIAGNOSIS — I70219 Atherosclerosis of native arteries of extremities with intermittent claudication, unspecified extremity: Secondary | ICD-10-CM | POA: Diagnosis not present

## 2015-11-30 DIAGNOSIS — Z1212 Encounter for screening for malignant neoplasm of rectum: Secondary | ICD-10-CM | POA: Diagnosis not present

## 2015-11-30 DIAGNOSIS — E785 Hyperlipidemia, unspecified: Secondary | ICD-10-CM | POA: Diagnosis not present

## 2015-11-30 DIAGNOSIS — G8929 Other chronic pain: Secondary | ICD-10-CM

## 2015-11-30 DIAGNOSIS — Z1159 Encounter for screening for other viral diseases: Secondary | ICD-10-CM

## 2015-11-30 LAB — POCT GLYCOSYLATED HEMOGLOBIN (HGB A1C): Hemoglobin A1C: 8.5

## 2015-11-30 MED ORDER — OXYCODONE-ACETAMINOPHEN 10-325 MG PO TABS: 1.0000 | ORAL_TABLET | Freq: Four times a day (QID) | ORAL | Status: AC | PRN

## 2015-11-30 MED ORDER — GLUCOSE BLOOD VI STRP: ORAL_STRIP | Status: DC

## 2015-11-30 MED ORDER — LANTUS SOLOSTAR 100 UNIT/ML ~~LOC~~ SOPN: 45.0000 [IU] | PEN_INJECTOR | Freq: Every day | SUBCUTANEOUS | Status: AC

## 2015-11-30 MED ORDER — TESTOSTERONE 40.5 MG/2.5GM (1.62%) TD GEL: 2.0000 | Freq: Every day | TRANSDERMAL | Status: AC

## 2015-11-30 MED ORDER — GLUCOSE BLOOD VI STRP: ORAL_STRIP | Status: AC

## 2015-11-30 MED ORDER — ZOLPIDEM TARTRATE 10 MG PO TABS: 10.0000 mg | ORAL_TABLET | Freq: Every evening | ORAL | Status: AC | PRN

## 2015-11-30 NOTE — Patient Instructions (Signed)

## 2015-11-30 NOTE — Progress Notes (Signed)
Subjective:    Patient ID: [name], male    DOB: 1946/10/01, 70 y.o.   MRN: [id number]  Patient in today for follow up of chronic medical problems.  Outpatient Encounter Prescriptions as of 11/30/2015  Medication Sig  . baclofen (LIORESAL) 10 MG tablet Take 1 tablet (10 mg total) by mouth every 8 (eight) hours as needed for muscle spasms.  . Cholecalciferol (VITAMIN D PO) Take by mouth.  . clopidogrel (PLAVIX) 75 MG tablet Take 1 tablet (75 mg total) by mouth daily.  . Ferrous Sulfate (IRON) 325 (65 FE) MG TABS Take 1 tablet by mouth 3 (three) times daily.  . fluticasone (FLONASE) 50 MCG/ACT nasal spray USE TWO SPRAY(S) IN EACH NOSTRIL ONCE DAILY  . furosemide (LASIX) 20 MG tablet Take 1 tablet (20 mg total) by mouth daily.  Marland Kitchen glucose blood (ONE TOUCH ULTRA TEST) test strip Test tid. Dx E11.9  . Insulin Pen Needle 31G X 6 MM MISC Use up to 4 times a day. Dx Code E11.9  . LANTUS SOLOSTAR 100 UNIT/ML Solostar Pen Inject 40 Units into the skin daily at 10 pm.  . lisinopril (PRINIVIL,ZESTRIL) 5 MG tablet Take 1 tablet (5 mg total) by mouth daily.  Marland Kitchen NOVOLOG FLEXPEN 100 UNIT/ML FlexPen Inject 16 Units into the skin 3 (three) times daily with meals.  Marland Kitchen oxyCODONE-acetaminophen (PERCOCET) 10-325 MG tablet Take 1 tablet by mouth every 6 (six) hours as needed for pain.  . pantoprazole (PROTONIX) 40 MG tablet Take 1 tablet (40 mg total) by mouth daily.  . sildenafil (VIAGRA) 100 MG tablet Take 1 tablet (100 mg total) by mouth daily as needed for erectile dysfunction.  . Testosterone (ANDROGEL) 40.5 MG/2.5GM (1.62%) GEL Place 2 Squirts onto the skin daily.  . traZODone (DESYREL) 50 MG tablet TAKE ONE-HALF TO ONE TABLET BY MOUTH AT BEDTIME AS NEEDED FOR SLEEP  . vitamin C (ASCORBIC ACID) 500 MG tablet Take 500 mg by mouth daily.     No facility-administered encounter medications on file as of 11/30/2015.      Hyperlipidemia This is a chronic problem. The current episode started more than 1  year ago. The problem is uncontrolled. Recent lipid tests were reviewed and are high. Pertinent negatives include no chest pain or shortness of breath. He is currently on no antihyperlipidemic treatment. Risk factors for coronary artery disease include diabetes mellitus, dyslipidemia, hypertension and male sex.  Hypertension This is a chronic problem. The current episode started more than 1 year ago. The problem is controlled. Associated symptoms include neck pain. Pertinent negatives include no chest pain, palpitations or shortness of breath. Risk factors for coronary artery disease include diabetes mellitus, dyslipidemia and male gender. Past treatments include diuretics and ACE inhibitors. The current treatment provides moderate improvement. Compliance problems include diet and exercise.  Hypertensive end-organ damage includes PVD.  Diabetes He presents for his follow-up diabetic visit. He has type 2 diabetes mellitus. No MedicAlert identification noted. His disease course has been stable. Pertinent negatives for hypoglycemia include no confusion or pallor. Pertinent negatives for diabetes include no chest pain, no polydipsia, no polyphagia, no polyuria, no visual change and no weakness. Diabetic complications include PVD. Risk factors for coronary artery disease include diabetes mellitus, dyslipidemia, hypertension and male sex. Current diabetic treatment includes insulin injections. He is compliant with treatment most of the time. His weight is stable. His breakfast blood glucose is taken between 8-9 am. His breakfast blood glucose range is generally >200 mg/dl. His overall blood glucose  range is >200 mg/dl. An ACE inhibitor/angiotensin II receptor blocker is being taken. He does not see a podiatrist.Eye exam is not current.  Anemia There has been no abdominal pain, confusion, light-headedness, pallor or palpitations.  Gastroesophageal Reflux He reports no abdominal pain, no chest pain, no coughing or  no sore throat.  PVD/CAD Plavix dialy- no bleeding that he is aware of . No longer sees PV surgeon. Has not seen cardiologist in over 2 years Chronic back and left hip pain Seeing pain management in [facility] Dr Naaman Plummer currently and taking Percocet 10/315m 1 po QID prn. Des not want to continue pain clinic. Would like to come here because of expense of going there. Insomnia trazadone to sleep - not working at all- says he is napping during the day because he is so tired. Hypogonadism androgel daily- helps with his energy level anemia On ferrous sulfate daily- no c/o constipation   Review of Systems  HENT: Negative for sore throat.   Respiratory: Negative for cough and shortness of breath.   Cardiovascular: Negative for chest pain and palpitations.  Gastrointestinal: Negative for abdominal pain.  Endocrine: Negative for polydipsia, polyphagia and polyuria.  Musculoskeletal: Positive for arthralgias and neck pain.  Skin: Negative for pallor.  Neurological: Negative for weakness and light-headedness.  Psychiatric/Behavioral: Negative for confusion.  All other systems reviewed and are negative.      Objective:   Physical Exam  Constitutional: He is oriented to person, place, and time. He appears well-developed and well-nourished.  HENT:  Head: Normocephalic.  Right Ear: External ear normal.  Left Ear: External ear normal.  Nose: Nose normal.  Mouth/Throat: Oropharynx is clear and moist.  Eyes: EOM are normal. Pupils are equal, round, and reactive to light.  Neck: Normal range of motion. Neck supple. No JVD present. No thyromegaly present.  Cardiovascular: Normal rate, regular rhythm, normal heart sounds and intact distal pulses.  Exam reveals no gallop and no friction rub.   No murmur heard. Pulmonary/Chest: Effort normal and breath sounds normal. No respiratory distress. He has no wheezes. He has no rales. He exhibits no tenderness.  Abdominal: Soft. Bowel sounds are normal.  He exhibits no mass. There is no tenderness.  Musculoskeletal: Normal range of motion. He exhibits no edema.  Pain with movement on lumbar spine (-) SLR bil  Lymphadenopathy:    He has no cervical adenopathy.  Neurological: He is alert and oriented to person, place, and time. No cranial nerve deficit.  Skin: Skin is warm and dry.  3cm annular scaley greyish lesion right posterior shoulder  Psychiatric: He has a normal mood and affect. His behavior is normal. Judgment and thought content normal.     BP 134/64 mmHg  Pulse 69  Temp(Src) 97.2 F (36.2 C) (Oral)  Ht 5' 11"  (1.803 m)  Wt 165 lb (74.844 kg)  BMI 23.02 kg/m2  Results for orders placed or performed in visit on 11/30/15  POCT glycosylated hemoglobin (Hb A1C)  Result Value Ref Range   Hemoglobin A1C 8.5            Assessment & Plan:  /1. Hyperlipidemia with target LDL less than 70 Low fat diet - Lipid panel  2. Essential hypertension Do not add salt to diet - CMP14+EGFR  3. Type 2 diabetes mellitus without complication, without long-term current use of insulin (HCC) Stricter carb counting Increased lantus ti 45u nightly - POCT glycosylated hemoglobin (Hb A1C) - LANTUS SOLOSTAR 100 UNIT/ML Solostar Pen; Inject 45 Units into the  skin daily at 10 pm.  Dispense: 21 mL; Refill: 5 - glucose blood (ONE TOUCH ULTRA TEST) test strip; Test tid. Dx E11.9  Dispense: 300 each; Refill: 0  4. Atherosclerosis of native artery of lower extremity with intermittent claudication, unspecified laterality ([place])  5. Iron deficiency anemia - Anemia Profile B  6. 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 Insomnia Bedtime ritual - zolpidem (AMBIEN) 10 MG tablet; Take 1 tablet (10 mg total) by mouth at bedtime as needed for sleep.  Dispense: 30 tablet; Refill: 2  8. Chronic back pain Back strengthening stretches -  oxyCODONE-acetaminophen (PERCOCET) 10-325 MG tablet; Take 1 tablet by mouth every 6 (six) hours as needed for pain.  Dispense: 120 tablet; Refill: 0 - oxyCODONE-acetaminophen (PERCOCET) 10-325 MG tablet; Take 1 tablet by mouth every 6 (six) hours as needed for pain.  Dispense: 120 tablet; Refill: 0 - oxyCODONE-acetaminophen (PERCOCET) 10-325 MG tablet; Take 1 tablet by mouth every 6 (six) hours as needed for pain.  Dispense: 120 tablet; Refill: 0  9. Screening for malignant neoplasm of the rectum - Fecal occult blood, imunochemical; Future  10. Need for hepatitis C screening test - Hepatitis C antibody  11. Iron deficiency anemia due to chronic blood loss   12. Hypogonadism male - Testosterone (ANDROGEL) 40.5 MG/2.5GM (1.62%) GEL; Place 2 Squirts onto the skin daily.  Dispense: 75 g; Refill: 1    Labs pending Health maintenance reviewed Diet and exercise encouraged Continue all meds Follow up  In 3 month   [place], FNP

## 2015-11-30 NOTE — Telephone Encounter (Signed)
Resent

## 2015-12-01 LAB — CMP14+EGFR
A/G RATIO: 1.6 (ref 1.1–2.5)
ALBUMIN: 3.8 g/dL (ref 3.5–4.8)
ALT: 10 IU/L (ref 0–44)
AST: 10 IU/L (ref 0–40)
Alkaline Phosphatase: 79 IU/L (ref 39–117)
BUN / CREAT RATIO: 16 (ref 10–22)
BUN: 12 mg/dL (ref 8–27)
Bilirubin Total: 0.2 mg/dL (ref 0.0–1.2)
CALCIUM: 8.9 mg/dL (ref 8.6–10.2)
CO2: 21 mmol/L (ref 18–29)
CREATININE: 0.75 mg/dL — AB (ref 0.76–1.27)
Chloride: 102 mmol/L (ref 96–106)
GFR, EST AFRICAN AMERICAN: 107 mL/min/{1.73_m2} (ref >59)
GFR, EST NON AFRICAN AMERICAN: 93 mL/min/{1.73_m2} (ref >59)
GLOBULIN, TOTAL: 2.4 g/dL (ref 1.5–4.5)
Glucose: 134 mg/dL — ABNORMAL HIGH (ref 65–99)
Potassium: 4.6 mmol/L (ref 3.5–5.2)
SODIUM: 142 mmol/L (ref 134–144)
Total Protein: 6.2 g/dL (ref 6.0–8.5)

## 2015-12-01 LAB — ANEMIA PROFILE B
BASOS: 1 %
Basophils Absolute: 0.1 10*3/uL (ref 0.0–0.2)
EOS (ABSOLUTE): 0.3 10*3/uL (ref 0.0–0.4)
Eos: 5 %
FERRITIN: 35 ng/mL (ref 30–400)
FOLATE: 14.5 ng/mL (ref >3.0)
HEMATOCRIT: 30.7 % — AB (ref 37.5–51.0)
Hemoglobin: 10.7 g/dL — ABNORMAL LOW (ref 12.6–17.7)
IRON: 105 ug/dL (ref 38–169)
Immature Grans (Abs): 0 10*3/uL (ref 0.0–0.1)
Immature Granulocytes: 1 %
Iron Saturation: 33 % (ref 15–55)
LYMPHS ABS: 1.3 10*3/uL (ref 0.7–3.1)
Lymphs: 22 %
MCH: 31.2 pg (ref 26.6–33.0)
MCHC: 34.9 g/dL (ref 31.5–35.7)
MCV: 90 fL (ref 79–97)
MONOCYTES: 5 %
Monocytes Absolute: 0.3 10*3/uL (ref 0.1–0.9)
NEUTROS ABS: 4.1 10*3/uL (ref 1.4–7.0)
Neutrophils: 66 %
Platelets: 307 10*3/uL (ref 150–379)
RBC: 3.43 x10E6/uL — AB (ref 4.14–5.80)
RDW: 13.2 % (ref 12.3–15.4)
RETIC CT PCT: 3.2 % — AB (ref 0.6–2.6)
Total Iron Binding Capacity: 318 ug/dL (ref 250–450)
UIBC: 213 ug/dL (ref 111–343)
VITAMIN B 12: 249 pg/mL (ref 211–946)
WBC: 6.1 10*3/uL (ref 3.4–10.8)

## 2015-12-01 LAB — LIPID PANEL
CHOL/HDL RATIO: 2.8 ratio (ref 0.0–5.0)
Cholesterol, Total: 131 mg/dL (ref 100–199)
HDL: 46 mg/dL (ref >39)
LDL CALC: 62 mg/dL (ref 0–99)
Triglycerides: 114 mg/dL (ref 0–149)
VLDL Cholesterol Cal: 23 mg/dL (ref 5–40)

## 2015-12-01 LAB — HEPATITIS C ANTIBODY: Hep C Virus Ab: <0.1 s/co ratio (ref 0.0–0.9)

## 2015-12-07 ENCOUNTER — Telehealth: Payer: Self-pay | Admitting: Family Medicine

## 2015-12-20 NOTE — Telephone Encounter (Signed)
Received call from EMS last night around midnight and was notified that patient passed from a cardiac arrest. He was in bed next to his girlfriend and she said any gas for and then didn't breathe again. EMS arrived and gave for epinephrine and patient was found in asystole throughout and fitted 40 minutes of CPR. The tests her blood sugar which was 173 and could not resuscitate the patient. He passed yesterday 12/06/2015 just before midnight. They're going to be sending information for [place] to sign the death certificate. Caryl Pina, MD [facility] 22-Dec-2015, 7:44 AM

## 2015-12-20 DEATH — deceased

## 2016-04-02 ENCOUNTER — Ambulatory Visit: Payer: Commercial Managed Care - HMO | Admitting: Nurse Practitioner
